# Patient Record
Sex: Male | Born: 1965 | Race: Black or African American | Hispanic: No | Marital: Married | Smoking: Former smoker
Health system: Southern US, Community
[De-identification: ages and names within clinical notes are randomized; demographics above are authoritative.]

## PROBLEM LIST (undated history)

## (undated) DIAGNOSIS — I10 Essential (primary) hypertension: Secondary | ICD-10-CM

## (undated) DIAGNOSIS — R7303 Prediabetes: Secondary | ICD-10-CM

## (undated) DIAGNOSIS — E785 Hyperlipidemia, unspecified: Secondary | ICD-10-CM

## (undated) DIAGNOSIS — M109 Gout, unspecified: Secondary | ICD-10-CM

---

## 2005-06-11 ENCOUNTER — Ambulatory Visit: Payer: Self-pay | Admitting: Urology

## 2005-06-25 ENCOUNTER — Ambulatory Visit: Payer: Self-pay | Admitting: Urology

## 2006-12-31 ENCOUNTER — Ambulatory Visit: Payer: Self-pay | Admitting: Urology

## 2007-10-02 ENCOUNTER — Ambulatory Visit: Payer: Self-pay | Admitting: Emergency Medicine

## 2007-10-03 ENCOUNTER — Ambulatory Visit: Payer: Self-pay | Admitting: Internal Medicine

## 2007-10-04 ENCOUNTER — Encounter: Payer: Self-pay | Admitting: Internal Medicine

## 2014-11-26 ENCOUNTER — Ambulatory Visit: Payer: Self-pay | Admitting: Gastroenterology

## 2015-04-22 LAB — SURGICAL PATHOLOGY

## 2020-05-06 ENCOUNTER — Other Ambulatory Visit: Payer: Self-pay

## 2020-05-06 ENCOUNTER — Emergency Department: Payer: BC Managed Care – PPO

## 2020-05-06 ENCOUNTER — Encounter: Payer: Self-pay | Admitting: Emergency Medicine

## 2020-05-06 ENCOUNTER — Emergency Department
Admission: EM | Admit: 2020-05-06 | Discharge: 2020-05-06 | Disposition: A | Discharge status: Home / Self Care | Payer: BC Managed Care – PPO | Attending: Emergency Medicine | Admitting: Emergency Medicine

## 2020-05-06 DIAGNOSIS — M6282 Rhabdomyolysis: Secondary | ICD-10-CM | POA: Insufficient documentation

## 2020-05-06 DIAGNOSIS — M25511 Pain in right shoulder: Secondary | ICD-10-CM | POA: Diagnosis present

## 2020-05-06 DIAGNOSIS — Z79899 Other long term (current) drug therapy: Secondary | ICD-10-CM | POA: Diagnosis not present

## 2020-05-06 DIAGNOSIS — M25512 Pain in left shoulder: Secondary | ICD-10-CM | POA: Insufficient documentation

## 2020-05-06 LAB — URINALYSIS, COMPLETE (UACMP) WITH MICROSCOPIC
Bacteria, UA: NONE SEEN
Bilirubin Urine: NEGATIVE
Glucose, UA: NEGATIVE mg/dL
Hgb urine dipstick: NEGATIVE
Ketones, ur: NEGATIVE mg/dL
Leukocytes,Ua: NEGATIVE
Nitrite: NEGATIVE
Protein, ur: NEGATIVE mg/dL
Specific Gravity, Urine: 1.017 (ref 1.005–1.030)
Squamous Epithelial / HPF: NONE SEEN (ref 0–5)
pH: 5 (ref 5.0–8.0)

## 2020-05-06 MED ORDER — DICLOFENAC SODIUM 50 MG PO TBEC: 50.0000 mg | DELAYED_RELEASE_TABLET | Freq: Two times a day (BID) | ORAL | 0 refills | Status: DC

## 2020-05-06 MED ORDER — CYCLOBENZAPRINE HCL 5 MG PO TABS
5.0000 mg | ORAL_TABLET | Freq: Three times a day (TID) | ORAL | 0 refills | Status: DC | PRN
Start: 2020-05-06 — End: 2021-03-17

## 2020-05-06 NOTE — ED Notes (Signed)
See triage note  Presents with bilateral shoulder pain  States he lifted a zero turn mower out of ditch about 3 weeks ago   Was seen at [facility] about 1 week ago  conts to have pain and decreased ROM

## 2020-05-06 NOTE — ED Provider Notes (Signed)
[facility] Emergency Department Provider Note ____________________________________________  Time seen: 0845  I have reviewed the triage vital signs and the nursing notes.  HISTORY  Chief Complaint  Shoulder Pain  HPI Daryl Jones is a 54 y.o. male presents to the ED for his 3rd visit related to bilateral shoulder pain. He was initially evaluated at a local urgent care center.  He presented after he apparently drove his 0 turn lawnmower into a ditch.  Patient reportedly was successful in pulling the lawnmower from the ditch.  He describes delayed onset of bilateral arm and muscle pain from that incident.  He was treated initially with ibuprofen for his discomfort.  He subsequently reported to a local urgent care, and was again treated and evaluated for delayed onset of muscle soreness and possible tendinitis to the bilateral shoulders.  He was given Voltaren gel and an anti-inflammatory at that time.  He presents today with continued pain and stiffness to the bilateral upper arms.  He is unable to extend or reach the arms overhead, even in an attempt to wash his face or brush his hair.  He denies any distal paresthesias, grip changes, chest pain, shortness of breath, or weakness.  He describes muscle pain and tightness to the bilateral arms.  Patient reports normal urination and denies any dark-colored urine.  He denies any significant injury at this time.  History reviewed. No pertinent past medical history.  There are no problems to display for this patient.  History reviewed. No pertinent surgical history.  Prior to Admission medications   Medication Sig Start Date End Date Taking? Authorizing Provider  amLODipine (NORVASC) 5 MG tablet Take 5 mg by mouth daily.   Yes [provider]  atorvastatin (LIPITOR) 40 MG tablet Take 40 mg by mouth daily.   Yes [provider]  lisinopril-hydrochlorothiazide (ZESTORETIC) 20-25 MG tablet Take 1 tablet by  mouth daily.   Yes [provider]  cyclobenzaprine (FLEXERIL) 5 MG tablet Take 1 tablet (5 mg total) by mouth 3 (three) times daily as needed. 05/06/20   Chanta Bauers, Dannielle Karvonen, PA-C  diclofenac (VOLTAREN) 50 MG EC tablet Take 1 tablet (50 mg total) by mouth 2 (two) times daily. 05/06/20   Corbyn Steedman, Dannielle Karvonen, PA-C    Allergies Morphine and related  No family history on file.  Social History Social History   Tobacco Use  . Smoking status: Never Smoker  . Smokeless tobacco: Never Used  Substance Use Topics  . Alcohol use: Not Currently  . Drug use: Not on file    Review of Systems  Constitutional: Negative for fever. Eyes: Negative for visual changes. ENT: Negative for sore throat. Cardiovascular: Negative for chest pain. Respiratory: Negative for shortness of breath. Gastrointestinal: Negative for abdominal pain, vomiting and diarrhea. Genitourinary: Negative for dysuria. Musculoskeletal: Negative for back pain.  Bilateral upper extremity muscle stiffness and weakness as noted. Skin: Negative for rash. Neurological: Negative for headaches, focal weakness or numbness. ____________________________________________  PHYSICAL EXAM:  VITAL SIGNS: ED Triage Vitals  Enc Vitals Group     BP 05/06/20 0808 (!) 161/96     Pulse Rate 05/06/20 0808 97     Resp 05/06/20 0808 16     Temp 05/06/20 0808 98.1 F (36.7 C)     Temp Source 05/06/20 0808 Oral     SpO2 05/06/20 0808 98 %     Weight 05/06/20 0809 251 lb (113.9 kg)     Height 05/06/20 0809 5\' 11"  (  1.803 m)     Head Circumference --      Peak Flow --      Pain Score 05/06/20 0809 8     Pain Loc --      Pain Edu? --      Excl. in GC? --     Constitutional: Alert and oriented. Well appearing and in no distress. Head: Normocephalic and atraumatic. Eyes: Conjunctivae are normal. Normal extraocular movements Neck: Supple. No thyromegaly. Cardiovascular: Normal rate, regular rhythm. Normal distal  pulses. Respiratory: Normal respiratory effort. No wheezes/rales/rhonchi. Musculoskeletal:  Nontender with normal range of motion in all extremities.  Neurologic:  Normal gait without ataxia. Normal speech and language. No gross focal neurologic deficits are appreciated. Skin:  Skin is warm, dry and intact. No rash noted. Psychiatric: Mood and affect are normal. Patient exhibits appropriate insight and ____________________________________________   RADIOLOGY  DG Left Shoulder IMPRESSION: 1.  No acute osseous abnormality. 2. Mild acromioclavicular and early glenohumeral osteoarthritis.  DG Right Shoulder IMPRESSION: 1.  No acute osseous abnormality. 2. Mild acromioclavicular osteoarthritis. ____________________________________________  LABORATORY  Labs Reviewed  URINALYSIS, COMPLETE (UACMP) WITH MICROSCOPIC - Abnormal; Notable for the following components:      Result Value   Color, Urine YELLOW (*)    APPearance HAZY (*)    All other components within normal limits   Procedures ____________________________________________  INITIAL IMPRESSION / ASSESSMENT AND PLAN / ED COURSE  DDX:  Rotator cuff tendinitis, AC separation, bursitis, biceps tendonitis, myositis/rhabdomyolisis  Patient with ED evaluation of bilateral muscle pain, soreness, and stiffness.  Clinical picture is consistent with a likely mild rhabdo myelitis secondary to his significant muscle strain.  X-rays are negative for any acute fractures or dislocations.  Patient's urinalysis did not reveal any myoglobin or hemoglobin.  He will treat with diclofenac as well as Flexeril.  He is given discharge instructions on the likely self-limited nature of his symptoms, but is encouraged to drink plenty of fluids and report any change in urination.  Return precautions have been reviewed but he is again referred to orthopedics in case symptoms are likely related to any underlying AC arthritis or rotator cuff tendinitis.  Daryl Jones was evaluated in Emergency Department on 05/06/2020 for the symptoms described in the history of present illness. He was evaluated in the context of the global COVID-19 pandemic, which necessitated consideration that the patient might be at risk for infection with the SARS-CoV-2 virus that causes COVID-19. Institutional protocols and algorithms that pertain to the evaluation of patients at risk for COVID-19 are in a state of rapid change based on information released by regulatory bodies including the CDC and federal and state organizations. These policies and algorithms were followed during the patient's care in the ED. ____________________________________________  FINAL CLINICAL IMPRESSION(S) / ED DIAGNOSES  Final diagnoses:  Acute pain of both shoulders  Non-traumatic rhabdomyolysis      Karmen Stabs, Charlesetta Ivory, PA-C 05/06/20 1529    Concha Se, MD 05/07/20 1910

## 2020-05-06 NOTE — ED Triage Notes (Signed)
Says he lifted very heavy object aobut 2 week ago and injured both shoulders.  He has been to urgent care and that has not helped.  He continues to have difficulty lifting his arms up.

## 2020-05-06 NOTE — Discharge Instructions (Addendum)
Your are being treated for overuse of the muscles causing severe muscle strain and stiffness. Drink plenty of non-carbonated drinks (Gatorade, water, etc) and monitor for dark urine. Take the prescription pain medicine and muscle relaxant as directed. Apply moist heat or perform epsom salt soaks to increase muscle movement. Follow-up with Ortho for continued shoulder joint pain/stiffness.

## 2020-05-06 NOTE — ED Triage Notes (Incomplete)
Says he picked up something very heavy a couple weeks ago and injured both shoulders.  Says he went to urgent care and they gave him ibuprofen and some gel and he still cant move his arms.  He has difficulty lifting them and could not slee ?

## 2020-05-30 ENCOUNTER — Ambulatory Visit: Payer: BC Managed Care – PPO | Attending: Internal Medicine

## 2020-05-30 DIAGNOSIS — Z23 Encounter for immunization: Secondary | ICD-10-CM

## 2020-05-30 NOTE — Progress Notes (Signed)
   Covid-19 Vaccination Clinic  Name:  Daryl Jones    MRN: [id number] DOB: 10-21-1966  05/30/2020  [name] was observed post Covid-19 immunization for 15 minutes without incident. He was provided with Vaccine Information Sheet and instruction to access the V-Safe system.   [name] was instructed to call 911 with any severe reactions post vaccine: Marland Kitchen Difficulty breathing  . Swelling of face and throat  . A fast heartbeat  . A bad rash all over body  . Dizziness and weakness   Immunizations Administered    Name Date Dose VIS Date Route   Moderna COVID-19 Vaccine 05/30/2020  2:35 PM 0.5 mL 11/2019 Intramuscular   Manufacturer: Moderna   Lot: [id number]   NDC: 55001-642-90

## 2020-07-16 ENCOUNTER — Ambulatory Visit: Payer: BC Managed Care – PPO | Attending: Critical Care Medicine

## 2020-07-16 DIAGNOSIS — Z23 Encounter for immunization: Secondary | ICD-10-CM

## 2020-07-16 NOTE — Progress Notes (Signed)
   Covid-19 Vaccination Clinic  Name:  Daryl Jones    MRN: [id number] DOB: July 16, 1966  07/16/2020  [name] was observed post Covid-19 immunization for 15 minutes without incident. He was provided with Vaccine Information Sheet and instruction to access the V-Safe system.   [name] was instructed to call 911 with any severe reactions post vaccine: Marland Kitchen Difficulty breathing  . Swelling of face and throat  . A fast heartbeat  . A bad rash all over body  . Dizziness and weakness   Immunizations Administered    Name Date Dose VIS Date Route   Moderna COVID-19 Vaccine 07/16/2020 12:37 PM 0.5 mL 11/2019 Intramuscular   Manufacturer: Moderna   Lot: [id number]   NDC: 19509-326-71

## 2021-03-17 ENCOUNTER — Encounter
Admission: EM | Disposition: A | Discharge status: Home / Self Care | Payer: Self-pay | Source: Home / Self Care | Attending: Emergency Medicine

## 2021-03-17 ENCOUNTER — Encounter: Payer: Self-pay | Admitting: Surgery

## 2021-03-17 ENCOUNTER — Emergency Department: Payer: BC Managed Care – PPO

## 2021-03-17 ENCOUNTER — Observation Stay: Payer: BC Managed Care – PPO | Admitting: Anesthesiology

## 2021-03-17 ENCOUNTER — Other Ambulatory Visit: Payer: Self-pay

## 2021-03-17 ENCOUNTER — Observation Stay
Admission: EM | Admit: 2021-03-17 | Discharge: 2021-03-18 | Disposition: A | Discharge status: Home / Self Care | Payer: BC Managed Care – PPO | Attending: Surgery | Admitting: Surgery

## 2021-03-17 DIAGNOSIS — K8001 Calculus of gallbladder with acute cholecystitis with obstruction: Secondary | ICD-10-CM

## 2021-03-17 DIAGNOSIS — K82A1 Gangrene of gallbladder in cholecystitis: Secondary | ICD-10-CM | POA: Diagnosis not present

## 2021-03-17 DIAGNOSIS — K8000 Calculus of gallbladder with acute cholecystitis without obstruction: Secondary | ICD-10-CM | POA: Diagnosis not present

## 2021-03-17 DIAGNOSIS — I10 Essential (primary) hypertension: Secondary | ICD-10-CM | POA: Diagnosis not present

## 2021-03-17 DIAGNOSIS — Z79899 Other long term (current) drug therapy: Secondary | ICD-10-CM | POA: Diagnosis not present

## 2021-03-17 DIAGNOSIS — K810 Acute cholecystitis: Secondary | ICD-10-CM | POA: Diagnosis present

## 2021-03-17 DIAGNOSIS — K82A2 Perforation of gallbladder in cholecystitis: Secondary | ICD-10-CM

## 2021-03-17 DIAGNOSIS — Z20822 Contact with and (suspected) exposure to covid-19: Secondary | ICD-10-CM | POA: Diagnosis not present

## 2021-03-17 DIAGNOSIS — R1011 Right upper quadrant pain: Secondary | ICD-10-CM | POA: Diagnosis present

## 2021-03-17 HISTORY — PX: CHOLECYSTECTOMY: SHX55

## 2021-03-17 LAB — TROPONIN I (HIGH SENSITIVITY)
Troponin I (High Sensitivity): 4 ng/L (ref <18)
Troponin I (High Sensitivity): 3 ng/L (ref <18)

## 2021-03-17 LAB — URINALYSIS, ROUTINE W REFLEX MICROSCOPIC
Bacteria, UA: NONE SEEN
Bilirubin Urine: NEGATIVE
Glucose, UA: NEGATIVE mg/dL
Ketones, ur: NEGATIVE mg/dL
Leukocytes,Ua: NEGATIVE
Nitrite: NEGATIVE
Protein, ur: NEGATIVE mg/dL
Specific Gravity, Urine: 1.018 (ref 1.005–1.030)
Squamous Epithelial / HPF: NONE SEEN (ref 0–5)
pH: 5 (ref 5.0–8.0)

## 2021-03-17 LAB — HEPATIC FUNCTION PANEL
ALT: 24 U/L (ref 0–44)
AST: 29 U/L (ref 15–41)
Albumin: 4.4 g/dL (ref 3.5–5.0)
Alkaline Phosphatase: 55 U/L (ref 38–126)
Bilirubin, Direct: 0.1 mg/dL (ref 0.0–0.2)
Indirect Bilirubin: 1.3 mg/dL — ABNORMAL HIGH (ref 0.3–0.9)
Total Bilirubin: 1.4 mg/dL — ABNORMAL HIGH (ref 0.3–1.2)
Total Protein: 8 g/dL (ref 6.5–8.1)

## 2021-03-17 LAB — BASIC METABOLIC PANEL
Anion gap: 12 (ref 5–15)
BUN: 15 mg/dL (ref 6–20)
CO2: 22 mmol/L (ref 22–32)
Calcium: 9.3 mg/dL (ref 8.9–10.3)
Chloride: 101 mmol/L (ref 98–111)
Creatinine, Ser: 0.79 mg/dL (ref 0.61–1.24)
GFR, Estimated: >60 mL/min (ref >60)
Glucose, Bld: 128 mg/dL — ABNORMAL HIGH (ref 70–99)
Potassium: 3.6 mmol/L (ref 3.5–5.1)
Sodium: 135 mmol/L (ref 135–145)

## 2021-03-17 LAB — BRAIN NATRIURETIC PEPTIDE: B Natriuretic Peptide: 6.4 pg/mL (ref 0.0–100.0)

## 2021-03-17 LAB — CBC
HCT: 41.2 % (ref 39.0–52.0)
Hemoglobin: 14.1 g/dL (ref 13.0–17.0)
MCH: 31.5 pg (ref 26.0–34.0)
MCHC: 34.2 g/dL (ref 30.0–36.0)
MCV: 92 fL (ref 80.0–100.0)
Platelets: 263 10*3/uL (ref 150–400)
RBC: 4.48 MIL/uL (ref 4.22–5.81)
RDW: 11.9 % (ref 11.5–15.5)
WBC: 10.3 10*3/uL (ref 4.0–10.5)
nRBC: 0 % (ref 0.0–0.2)

## 2021-03-17 LAB — RESP PANEL BY RT-PCR (FLU A&B, COVID) ARPGX2
Influenza A by PCR: NEGATIVE
Influenza B by PCR: NEGATIVE
SARS Coronavirus 2 by RT PCR: NEGATIVE

## 2021-03-17 LAB — LIPASE, BLOOD: Lipase: 29 U/L (ref 11–51)

## 2021-03-17 LAB — ETHANOL: Alcohol, Ethyl (B): <10 mg/dL (ref <10)

## 2021-03-17 IMAGING — CT CT ABD-PELV W/ CM
2 of 5 series · 15 of 46 positions shown, 17 images · IV contrast (APPLIED)
Comparison: Portable chest x-ray earlier today.

CLINICAL DATA: 54-year-old male with chest pain, shortness of
breath, left upper quadrant abdominal pain. Appears and distress.

EXAM:
CT ABDOMEN AND PELVIS WITH CONTRAST
TECHNIQUE: Multidetector CT imaging of the abdomen and pelvis was performed
using the standard protocol following bolus administration of
intravenous contrast.
CONTRAST:  100mL OMNIPAQUE IOHEXOL 300 MG/ML  SOLN

[Series 2: routine abd/pel with · axial · 0.98mm/px · z∈[-1063,-613]mm · 12 of 101 slices shown, 14 images]
[im 6/101  soft-tissue]
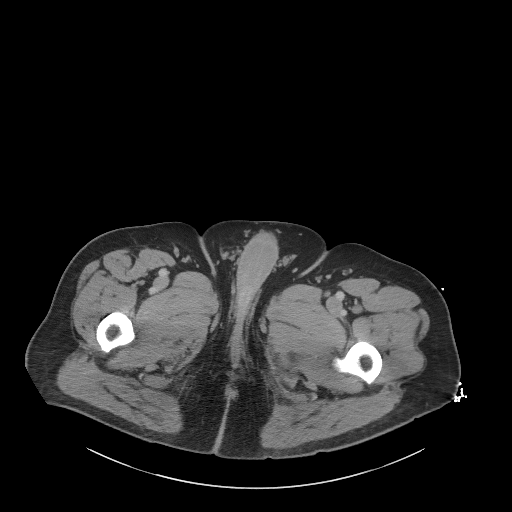
[im 6/101  bone]
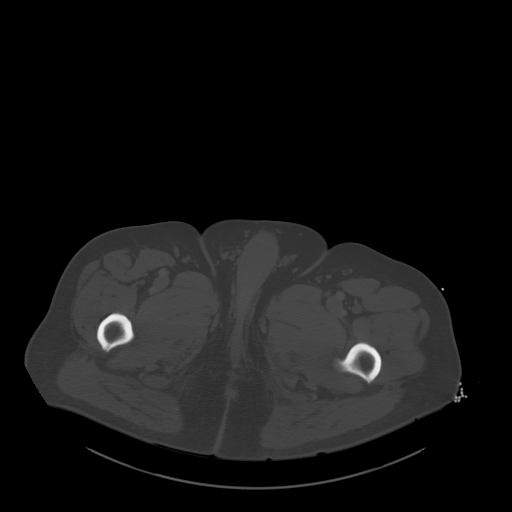
[im 16/101  soft-tissue]
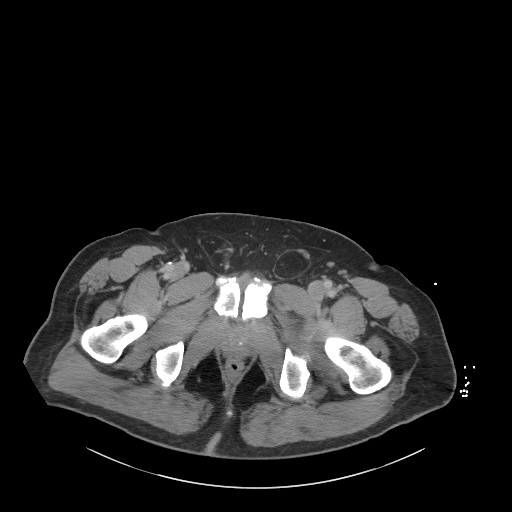
[im 21/101  soft-tissue]
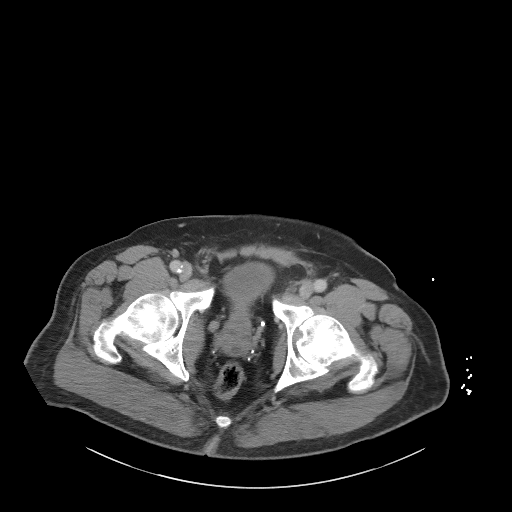
[im 31/101  soft-tissue]
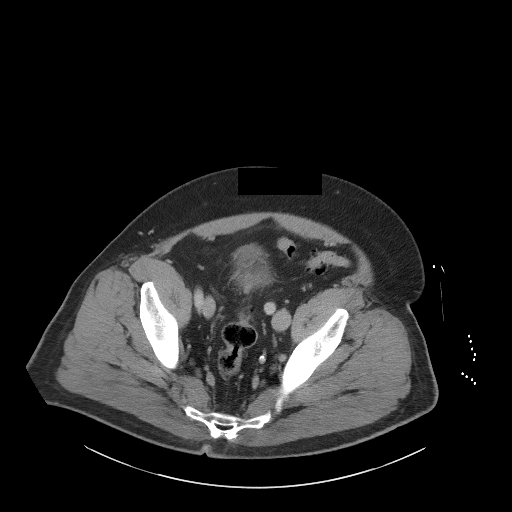
[im 41/101  soft-tissue]
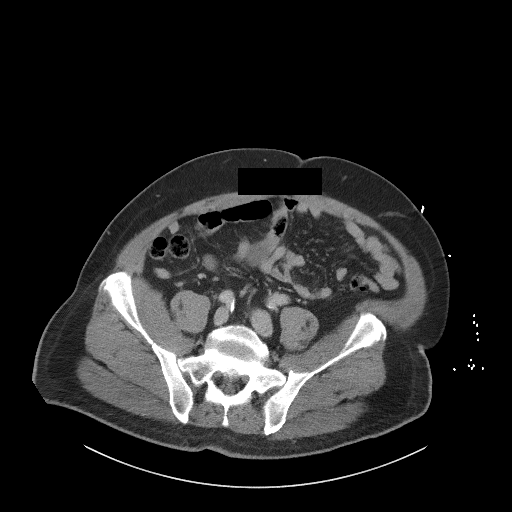
[im 46/101  soft-tissue]
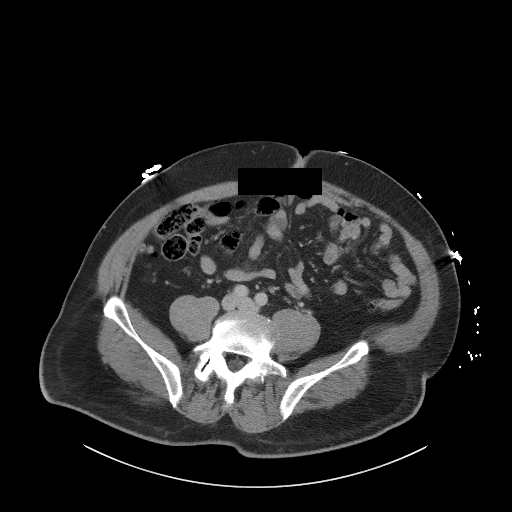
[im 56/101  soft-tissue]
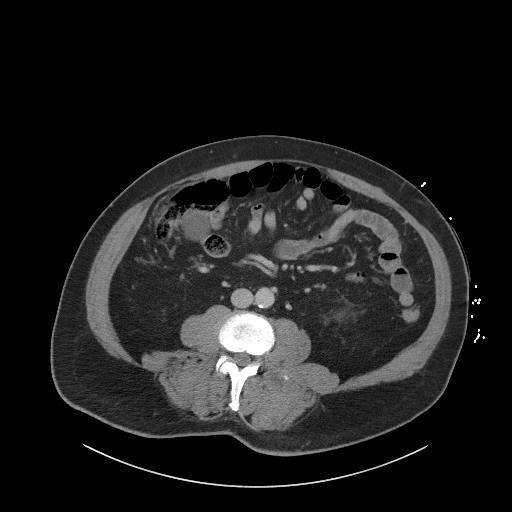
[im 61/101  soft-tissue]
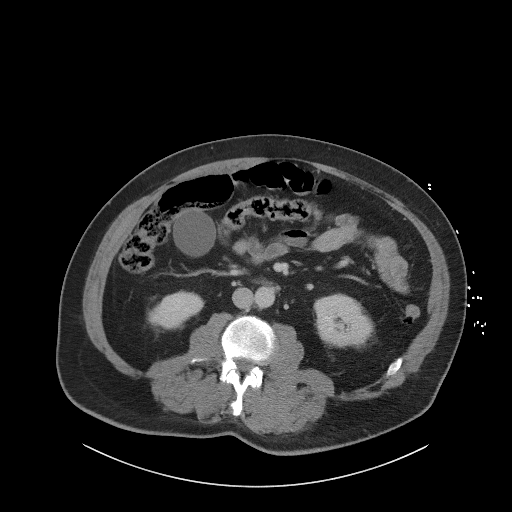
[im 71/101  soft-tissue]
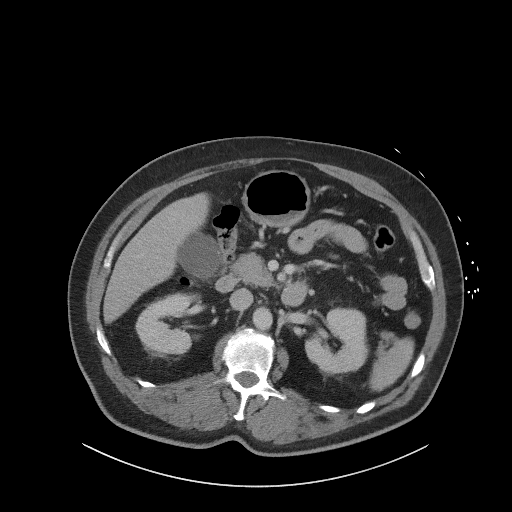
[im 71/101  bone]
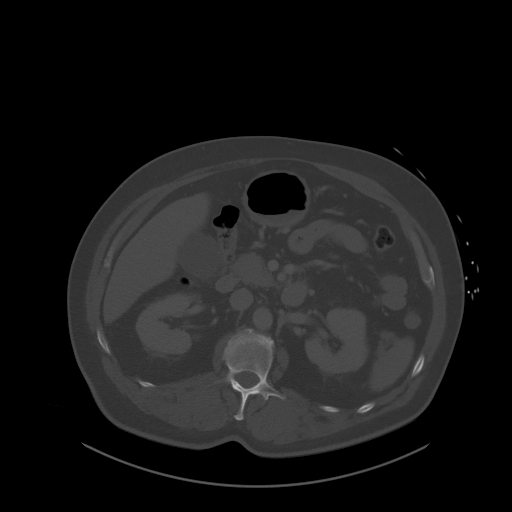
[im 81/101  soft-tissue]
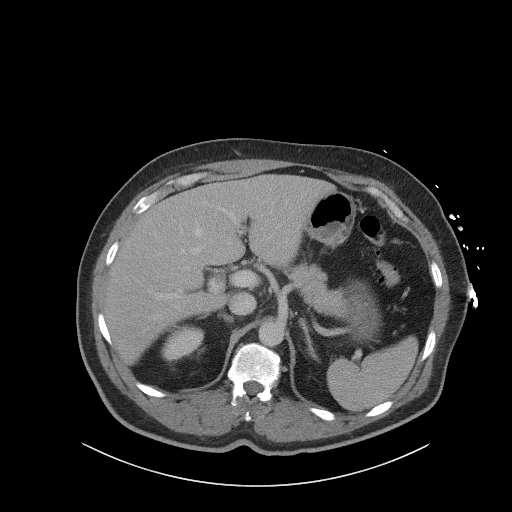
[im 86/101  soft-tissue]
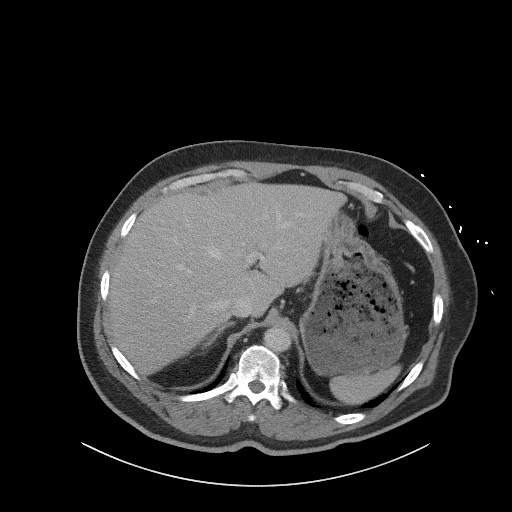
[im 96/101  soft-tissue]
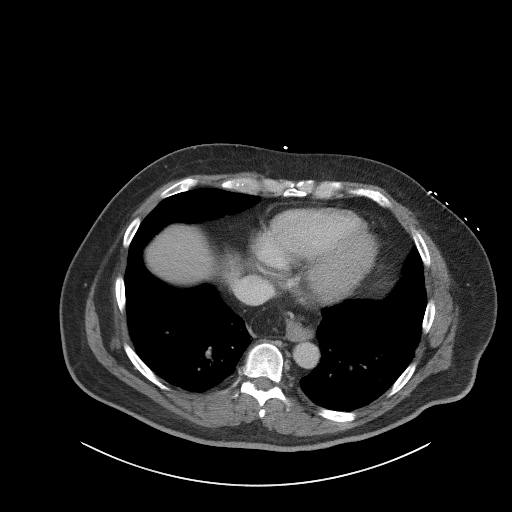

[Series 5: coronal st · coronal · 0.86mm/px · 3 of 111 slices shown]
[im 37/111  soft-tissue]
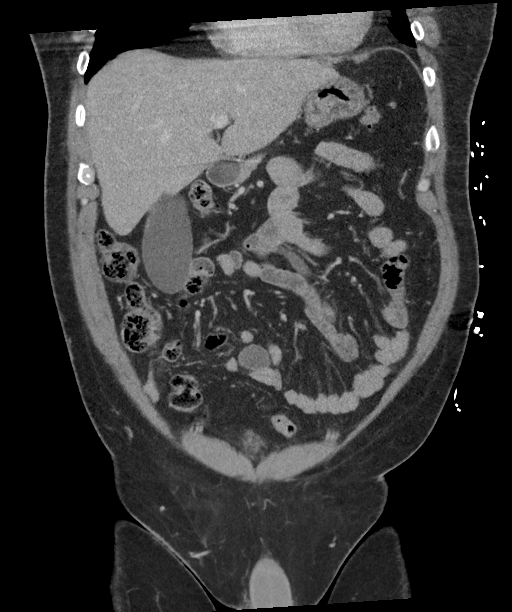
[im 49/111  soft-tissue]
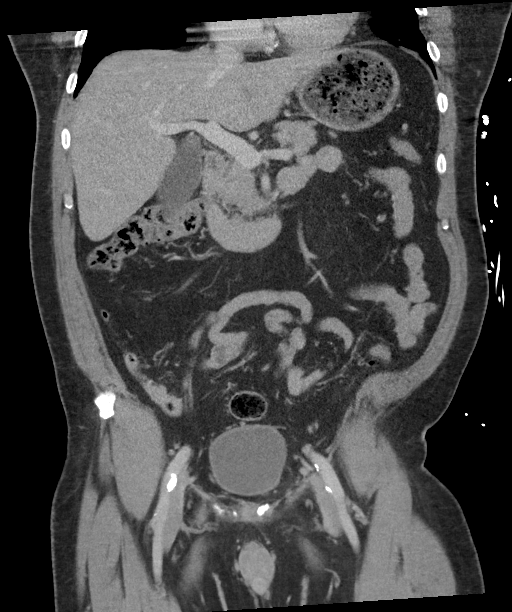
[im 62/111  soft-tissue]
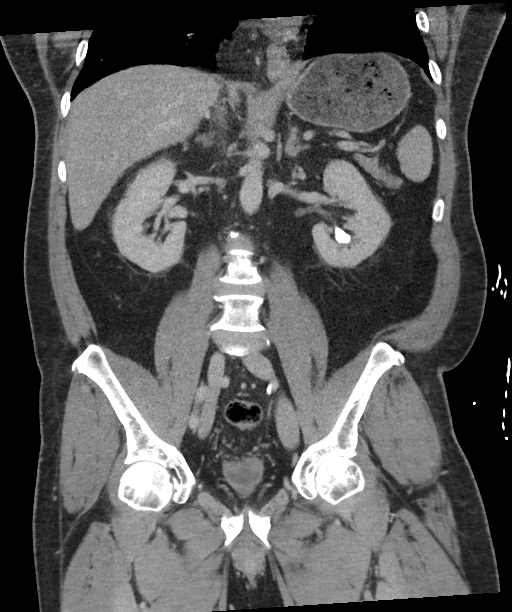

[15 of 46 positions shown; findings below may reference images not displayed]

FINDINGS: Lower chest: Mild respiratory motion. Mostly dependent opacity at
both lung bases which more resembles atelectasis than infection. No
pleural effusion. Cardiac size at the upper limits of normal. No
pericardial effusion.

Hepatobiliary: The gallbladder is enlarged and segments of the
gallbladder wall appear indistinct (sagittal image 51) with a 2 cm
hyperdense stone in the gallbladder neck (coronal image 53). No
superimposed bile duct enlargement. Liver enhancement remains within
normal limits.

Pancreas: Negative.

Spleen: Negative.

Adrenals/Urinary Tract: Normal adrenal glands.

Negative right kidney and right ureter.

There is a left lower pole developing staghorn renal calculus which
is triangular and about 16 mm in size currently. But no left
hydronephrosis. Decompressed and negative left ureter. Symmetric
renal enhancement and contrast excretion. Diminutive and
unremarkable urinary bladder. Numerous pelvic phleboliths
incidentally noted.

Stomach/Bowel: Redundant sigmoid colon with diverticulosis from the
splenic flexure to the mid sigmoid. No active inflammation
identified. Negative rectum. Redundant right colon. Normal appendix
on series 2, image 52. Flocculated material in the terminal ileum
which otherwise appears negative. No dilated small bowel. Small fat
containing hiatal hernia. Stomach is mildly to moderately distended
with air and food debris. Duodenum is decompressed and negative. No
free air. No free fluid. No mesenteric inflammation identified.

Vascular/Lymphatic: Major arterial structures are patent.
Iliofemoral calcified atherosclerosis, aorta largely spared. Portal
venous system is patent. No lymphadenopathy.

Reproductive: Fat containing left inguinal hernia (series 2, image
86). Otherwise negative.

Other: No pelvic free fluid.

Musculoskeletal: No acute osseous abnormality identified. Advanced
facet degeneration at the lumbosacral junction.
IMPRESSION: 1. Positive for evidence of Acute Cholecystitis: dilated gallbladder
with a relatively large 2 cm stone in the neck. No superimposed bile
duct enlargement.
2. No other acute or inflammatory process identified in the abdomen
or pelvis. Visible lung base opacity more resembles atelectasis than
infection.
3. Developing left lower pole staghorn renal calculus, currently 16
mm. No obstructive uropathy.
4. Diverticulosis of the large bowel. Fat containing hiatal and left
inguinal hernias.

## 2021-03-17 SURGERY — CHOLECYSTECTOMY, ROBOT-ASSISTED, LAPAROSCOPIC
Anesthesia: General | Site: Abdomen

## 2021-03-17 MED ORDER — IPRATROPIUM-ALBUTEROL 0.5-2.5 (3) MG/3ML IN SOLN: 3.0000 mL | Freq: Once | RESPIRATORY_TRACT | Status: DC

## 2021-03-17 MED ORDER — IOHEXOL 300 MG/ML  SOLN
100.0000 mL | Freq: Once | INTRAMUSCULAR | Status: AC | PRN
  Administered 2021-03-17: 100 mL via INTRAVENOUS

## 2021-03-17 MED ORDER — LACTATED RINGERS IV SOLN: INTRAVENOUS | Status: DC | PRN

## 2021-03-17 MED ORDER — MIDAZOLAM HCL 2 MG/2ML IJ SOLN
INTRAMUSCULAR | Status: AC
  Filled 2021-03-17: qty 2

## 2021-03-17 MED ORDER — KETOROLAC TROMETHAMINE 30 MG/ML IJ SOLN: 30.0000 mg | Freq: Four times a day (QID) | INTRAMUSCULAR | Status: DC | PRN

## 2021-03-17 MED ORDER — ONDANSETRON HCL 4 MG/2ML IJ SOLN: 4.0000 mg | Freq: Once | INTRAMUSCULAR | Status: DC | PRN

## 2021-03-17 MED ORDER — DEXAMETHASONE SODIUM PHOSPHATE 10 MG/ML IJ SOLN
INTRAMUSCULAR | Status: AC
  Filled 2021-03-17: qty 1

## 2021-03-17 MED ORDER — BUPIVACAINE-EPINEPHRINE (PF) 0.25% -1:200000 IJ SOLN
INTRAMUSCULAR | Status: AC
  Filled 2021-03-17: qty 30

## 2021-03-17 MED ORDER — PHENYLEPHRINE HCL (PRESSORS) 10 MG/ML IV SOLN
INTRAVENOUS | Status: DC | PRN
  Administered 2021-03-17: 200 ug via INTRAVENOUS
  Administered 2021-03-17: 100 ug via INTRAVENOUS
  Administered 2021-03-17: 200 ug via INTRAVENOUS
  Administered 2021-03-17: 100 ug via INTRAVENOUS
  Administered 2021-03-17: 200 ug via INTRAVENOUS
  Administered 2021-03-17: 100 ug via INTRAVENOUS

## 2021-03-17 MED ORDER — HYDROMORPHONE HCL 1 MG/ML IJ SOLN: 0.5000 mg | INTRAMUSCULAR | Status: DC | PRN

## 2021-03-17 MED ORDER — ONDANSETRON HCL 4 MG/2ML IJ SOLN: 4.0000 mg | Freq: Four times a day (QID) | INTRAMUSCULAR | Status: DC | PRN

## 2021-03-17 MED ORDER — BUPIVACAINE-EPINEPHRINE (PF) 0.25% -1:200000 IJ SOLN
INTRAMUSCULAR | Status: DC | PRN
  Administered 2021-03-17: 20 mL via PERINEURAL

## 2021-03-17 MED ORDER — OXYCODONE HCL 5 MG/5ML PO SOLN: 5.0000 mg | Freq: Once | ORAL | Status: DC | PRN

## 2021-03-17 MED ORDER — ROCURONIUM BROMIDE 100 MG/10ML IV SOLN
INTRAVENOUS | Status: DC | PRN
  Administered 2021-03-17: 20 mg via INTRAVENOUS
  Administered 2021-03-17: 50 mg via INTRAVENOUS
  Administered 2021-03-17: 30 mg via INTRAVENOUS

## 2021-03-17 MED ORDER — LIDOCAINE HCL (CARDIAC) PF 100 MG/5ML IV SOSY
PREFILLED_SYRINGE | INTRAVENOUS | Status: DC | PRN
  Administered 2021-03-17: 100 mg via INTRAVENOUS

## 2021-03-17 MED ORDER — ACETAMINOPHEN 500 MG PO TABS
1000.0000 mg | ORAL_TABLET | Freq: Four times a day (QID) | ORAL | Status: DC
  Administered 2021-03-18 (×2): 1000 mg via ORAL
  Filled 2021-03-17 (×5): qty 2

## 2021-03-17 MED ORDER — ONDANSETRON HCL 4 MG/2ML IJ SOLN
INTRAMUSCULAR | Status: AC
  Filled 2021-03-17: qty 2

## 2021-03-17 MED ORDER — HYDROMORPHONE HCL 1 MG/ML IJ SOLN
1.0000 mg | INTRAMUSCULAR | Status: DC | PRN
  Administered 2021-03-17: 1 mg via INTRAVENOUS
  Filled 2021-03-17: qty 1

## 2021-03-17 MED ORDER — IPRATROPIUM-ALBUTEROL 0.5-2.5 (3) MG/3ML IN SOLN
RESPIRATORY_TRACT | Status: AC
  Administered 2021-03-17: 3 mL
  Filled 2021-03-17: qty 3

## 2021-03-17 MED ORDER — PROPOFOL 10 MG/ML IV BOLUS
INTRAVENOUS | Status: AC
  Filled 2021-03-17: qty 20

## 2021-03-17 MED ORDER — LISINOPRIL 20 MG PO TABS
20.0000 mg | ORAL_TABLET | Freq: Every day | ORAL | Status: DC
  Administered 2021-03-17 – 2021-03-18 (×2): 20 mg via ORAL
  Filled 2021-03-17 (×2): qty 1

## 2021-03-17 MED ORDER — FENTANYL CITRATE (PF) 100 MCG/2ML IJ SOLN
INTRAMUSCULAR | Status: AC
  Filled 2021-03-17: qty 2

## 2021-03-17 MED ORDER — ACETAMINOPHEN 500 MG PO TABS
ORAL_TABLET | ORAL | Status: AC
  Administered 2021-03-17: 1000 mg via ORAL
  Filled 2021-03-17: qty 2

## 2021-03-17 MED ORDER — INDOCYANINE GREEN 25 MG IV SOLR
2.5000 mg | Freq: Once | INTRAVENOUS | Status: AC
  Administered 2021-03-17: 2.5 mg via INTRAVENOUS
  Filled 2021-03-17: qty 1

## 2021-03-17 MED ORDER — SODIUM CHLORIDE 0.9 % IV SOLN: INTRAVENOUS | Status: DC

## 2021-03-17 MED ORDER — PROPOFOL 10 MG/ML IV BOLUS
INTRAVENOUS | Status: DC | PRN
  Administered 2021-03-17: 120 mg via INTRAVENOUS

## 2021-03-17 MED ORDER — OXYCODONE HCL 5 MG PO TABS: 5.0000 mg | ORAL_TABLET | ORAL | Status: DC | PRN

## 2021-03-17 MED ORDER — ONDANSETRON HCL 4 MG/2ML IJ SOLN
INTRAMUSCULAR | Status: DC | PRN
  Administered 2021-03-17: 4 mg via INTRAVENOUS

## 2021-03-17 MED ORDER — ONDANSETRON 4 MG PO TBDP: 4.0000 mg | ORAL_TABLET | Freq: Four times a day (QID) | ORAL | Status: DC | PRN

## 2021-03-17 MED ORDER — ONDANSETRON HCL 4 MG/2ML IJ SOLN
4.0000 mg | Freq: Once | INTRAMUSCULAR | Status: AC
  Administered 2021-03-17: 4 mg via INTRAVENOUS
  Filled 2021-03-17: qty 2

## 2021-03-17 MED ORDER — KETOROLAC TROMETHAMINE 30 MG/ML IJ SOLN
30.0000 mg | Freq: Four times a day (QID) | INTRAMUSCULAR | Status: AC
  Administered 2021-03-17: 30 mg via INTRAVENOUS
  Filled 2021-03-17: qty 1

## 2021-03-17 MED ORDER — SUCCINYLCHOLINE CHLORIDE 20 MG/ML IJ SOLN
INTRAMUSCULAR | Status: DC | PRN
  Administered 2021-03-17: 100 mg via INTRAVENOUS

## 2021-03-17 MED ORDER — LIDOCAINE HCL (PF) 2 % IJ SOLN
INTRAMUSCULAR | Status: AC
  Filled 2021-03-17: qty 5

## 2021-03-17 MED ORDER — HYDROMORPHONE HCL 1 MG/ML IJ SOLN
1.0000 mg | Freq: Once | INTRAMUSCULAR | Status: AC
  Administered 2021-03-17: 1 mg via INTRAVENOUS
  Filled 2021-03-17: qty 1

## 2021-03-17 MED ORDER — ONDANSETRON HCL 4 MG/2ML IJ SOLN
4.0000 mg | Freq: Four times a day (QID) | INTRAMUSCULAR | Status: DC | PRN
  Administered 2021-03-17: 4 mg via INTRAVENOUS
  Filled 2021-03-17: qty 2

## 2021-03-17 MED ORDER — SODIUM CHLORIDE 0.9 % IV BOLUS (SEPSIS): 1000.0000 mL | Freq: Once | INTRAVENOUS | Status: DC

## 2021-03-17 MED ORDER — FENTANYL CITRATE (PF) 100 MCG/2ML IJ SOLN
25.0000 ug | INTRAMUSCULAR | Status: DC | PRN
  Administered 2021-03-17: 25 ug via INTRAVENOUS

## 2021-03-17 MED ORDER — SODIUM CHLORIDE 0.9 % IV SOLN
INTRAVENOUS | Status: DC | PRN
  Administered 2021-03-17: 50 ug/min via INTRAVENOUS

## 2021-03-17 MED ORDER — BUPIVACAINE LIPOSOME 1.3 % IJ SUSP
INTRAMUSCULAR | Status: AC
  Filled 2021-03-17: qty 20

## 2021-03-17 MED ORDER — SODIUM CHLORIDE 0.9 % IV SOLN
INTRAVENOUS | Status: DC
Start: 2021-03-17 — End: 2021-03-17

## 2021-03-17 MED ORDER — HYDROCHLOROTHIAZIDE 25 MG PO TABS
25.0000 mg | ORAL_TABLET | Freq: Every day | ORAL | Status: DC
  Administered 2021-03-17 – 2021-03-18 (×2): 25 mg via ORAL
  Filled 2021-03-17 (×2): qty 1

## 2021-03-17 MED ORDER — SUGAMMADEX SODIUM 200 MG/2ML IV SOLN
INTRAVENOUS | Status: DC | PRN
  Administered 2021-03-17: 200 mg via INTRAVENOUS

## 2021-03-17 MED ORDER — SODIUM CHLORIDE 0.9 % IV SOLN
2.0000 g | INTRAVENOUS | Status: DC
  Administered 2021-03-17 – 2021-03-18 (×2): 2 g via INTRAVENOUS
  Filled 2021-03-17 (×2): qty 2
  Filled 2021-03-17: qty 20

## 2021-03-17 MED ORDER — LISINOPRIL-HYDROCHLOROTHIAZIDE 20-25 MG PO TABS: 1.0000 | ORAL_TABLET | Freq: Every day | ORAL | Status: DC

## 2021-03-17 MED ORDER — OXYCODONE HCL 5 MG PO TABS: 5.0000 mg | ORAL_TABLET | Freq: Once | ORAL | Status: DC | PRN

## 2021-03-17 MED ORDER — AMLODIPINE BESYLATE 5 MG PO TABS
5.0000 mg | ORAL_TABLET | Freq: Every day | ORAL | Status: DC
  Administered 2021-03-17 – 2021-03-18 (×2): 5 mg via ORAL
  Filled 2021-03-17 (×2): qty 1

## 2021-03-17 MED ORDER — ATORVASTATIN CALCIUM 20 MG PO TABS
40.0000 mg | ORAL_TABLET | Freq: Every day | ORAL | Status: DC
  Administered 2021-03-17 – 2021-03-18 (×2): 40 mg via ORAL
  Filled 2021-03-17 (×2): qty 2

## 2021-03-17 MED ORDER — PIPERACILLIN-TAZOBACTAM 3.375 G IVPB 30 MIN
3.3750 g | Freq: Once | INTRAVENOUS | Status: AC
  Administered 2021-03-17: 3.375 g via INTRAVENOUS
  Filled 2021-03-17: qty 50

## 2021-03-17 MED ORDER — DEXAMETHASONE SODIUM PHOSPHATE 10 MG/ML IJ SOLN
INTRAMUSCULAR | Status: DC | PRN
  Administered 2021-03-17: 10 mg via INTRAVENOUS

## 2021-03-17 MED ORDER — FENTANYL CITRATE (PF) 100 MCG/2ML IJ SOLN
INTRAMUSCULAR | Status: DC | PRN
  Administered 2021-03-17: 100 ug via INTRAVENOUS
  Administered 2021-03-17 (×2): 50 ug via INTRAVENOUS

## 2021-03-17 MED ORDER — HEPARIN SODIUM (PORCINE) 5000 UNIT/ML IJ SOLN
5000.0000 [IU] | Freq: Three times a day (TID) | INTRAMUSCULAR | Status: DC
  Administered 2021-03-18: 5000 [IU] via SUBCUTANEOUS
  Filled 2021-03-17: qty 1

## 2021-03-17 SURGICAL SUPPLY — 49 items
BLADE SURG 15 STRL LF DISP TIS (BLADE) ×1 IMPLANT
BLADE SURG 15 STRL SS (BLADE) ×1
CANISTER SUCT 1200ML W/VALVE (MISCELLANEOUS) ×2 IMPLANT
CHLORAPREP W/TINT 26 (MISCELLANEOUS) ×2 IMPLANT
CLIP VESOLOCK LG 6/CT PURPLE (CLIP) ×2 IMPLANT
CLIP VESOLOCK MED LG 6/CT (CLIP) ×4 IMPLANT
COVER TIP SHEARS 8 DVNC (MISCELLANEOUS) ×1 IMPLANT
COVER TIP SHEARS 8MM DA VINCI (MISCELLANEOUS) ×1
COVER WAND RF STERILE (DRAPES) ×2 IMPLANT
DECANTER SPIKE VIAL GLASS SM (MISCELLANEOUS) ×2 IMPLANT
DEFOGGER SCOPE WARMER CLEARIFY (MISCELLANEOUS) ×2 IMPLANT
DERMABOND ADVANCED (GAUZE/BANDAGES/DRESSINGS) ×1
DERMABOND ADVANCED .7 DNX12 (GAUZE/BANDAGES/DRESSINGS) ×1 IMPLANT
DRAPE ARM DVNC X/XI (DISPOSABLE) ×4 IMPLANT
DRAPE COLUMN DVNC XI (DISPOSABLE) ×1 IMPLANT
DRAPE DA VINCI XI ARM (DISPOSABLE) ×4
DRAPE DA VINCI XI COLUMN (DISPOSABLE) ×1
ELECT CAUTERY BLADE 6.4 (BLADE) ×2 IMPLANT
GLOVE ORTHO TXT STRL SZ7.5 (GLOVE) ×4 IMPLANT
GOWN STRL REUS W/ TWL LRG LVL3 (GOWN DISPOSABLE) ×4 IMPLANT
GOWN STRL REUS W/TWL LRG LVL3 (GOWN DISPOSABLE) ×4
GRASPER SUT TROCAR 14GX15 (MISCELLANEOUS) IMPLANT
INFUSOR MANOMETER BAG 3000ML (MISCELLANEOUS) IMPLANT
IRRIGATION STRYKERFLOW (MISCELLANEOUS) ×2 IMPLANT
IRRIGATOR STRYKERFLOW (MISCELLANEOUS) ×4
IRRIGATOR SUCT 8 DISP DVNC XI (IRRIGATION / IRRIGATOR) IMPLANT
IRRIGATOR SUCTION 8MM XI DISP (IRRIGATION / IRRIGATOR)
IV NS IRRIG 3000ML ARTHROMATIC (IV SOLUTION) ×2 IMPLANT
KIT PINK PAD W/HEAD ARE REST (MISCELLANEOUS) ×2
KIT PINK PAD W/HEAD ARM REST (MISCELLANEOUS) ×1 IMPLANT
KIT TURNOVER KIT A (KITS) ×2 IMPLANT
LABEL OR SOLS (LABEL) ×2 IMPLANT
MANIFOLD NEPTUNE II (INSTRUMENTS) ×2 IMPLANT
NEEDLE HYPO 22GX1.5 SAFETY (NEEDLE) ×2 IMPLANT
NEEDLE INSUFFLATION 14GA 120MM (NEEDLE) IMPLANT
NS IRRIG 500ML POUR BTL (IV SOLUTION) ×2 IMPLANT
PACK LAP CHOLECYSTECTOMY (MISCELLANEOUS) ×2 IMPLANT
PENCIL ELECTRO HAND CTR (MISCELLANEOUS) ×2 IMPLANT
POUCH SPECIMEN RETRIEVAL 10MM (ENDOMECHANICALS) ×2 IMPLANT
SEAL CANN UNIV 5-8 DVNC XI (MISCELLANEOUS) ×4 IMPLANT
SEAL XI 5MM-8MM UNIVERSAL (MISCELLANEOUS) ×4
SET TUBE SMOKE EVAC HIGH FLOW (TUBING) ×2 IMPLANT
SLEEVE PROTECTION STRL DISP (MISCELLANEOUS) ×2 IMPLANT
SOLUTION ELECTROLUBE (MISCELLANEOUS) ×2 IMPLANT
SUT MNCRL 4-0 (SUTURE) ×2
SUT MNCRL 4-0 27XMFL (SUTURE) ×2
SUT VICRYL 0 AB UR-6 (SUTURE) ×2 IMPLANT
SUTURE MNCRL 4-0 27XMF (SUTURE) ×2 IMPLANT
TROCAR Z-THREAD FIOS 11X100 BL (TROCAR) ×2 IMPLANT

## 2021-03-17 NOTE — H&P (Signed)
[facility] SURGICAL HISTORY & PHYSICAL (cpt [id number])  HISTORY OF PRESENT ILLNESS (HPI):  55 y.o. male presented to [facility] ED this morning for chest pain. Patient reports he noticed the acute onset of epigastric abdominal pain radiating to the RUQ yesterday evening around 5PM. This is a sharp aching pain which at the worst was rated 8/10. He has had no relief from this since the onset. He has associated nausea. He denied any fever, chills, cough, CP, SOB, emesis, or bowel changes. No history of similar. He does endorse a previous umbilical and inguinal hernia repair, but unsure when this was. Work up in the ED revealed normal WBC 10.3K, renal function normal with sCr - 0.79, very mild hyperbilirubinemia to 1.4, and CT Abdomen/Pelvis was concerning for large gallstone in the neck of a distended gallbladder concerning for acute cholecystitis.   General surgery is consulted by emergency medicine physician Dr Rochele Raring, DO for evaluation and management of acute cholecystitis.    PAST MEDICAL HISTORY (PMH):  No past medical history on file.  Reviewed. Otherwise negative.   PAST SURGICAL HISTORY (PSH):  No past surgical history on file.  Reviewed. Otherwise negative.   MEDICATIONS:  Prior to Admission medications   Medication Sig Start Date End Date Taking? Authorizing Provider  amLODipine (NORVASC) 5 MG tablet Take 5 mg by mouth daily.    [provider]  atorvastatin (LIPITOR) 40 MG tablet Take 40 mg by mouth daily.    [provider]  cyclobenzaprine (FLEXERIL) 5 MG tablet Take 1 tablet (5 mg total) by mouth 3 (three) times daily as needed. 05/06/20   Menshew, Charlesetta Ivory, PA-C  diclofenac (VOLTAREN) 50 MG EC tablet Take 1 tablet (50 mg total) by mouth 2 (two) times daily. 05/06/20   Menshew, Charlesetta Ivory, PA-C  lisinopril-hydrochlorothiazide (ZESTORETIC) 20-25 MG tablet Take 1 tablet by mouth daily.    [provider]     ALLERGIES:  Allergies   Allergen Reactions  . Morphine And Related Nausea And Vomiting     SOCIAL HISTORY:  Social History   Socioeconomic History  . Marital status: Married    Spouse name: Not on file  . Number of children: Not on file  . Years of education: Not on file  . Highest education level: Not on file  Occupational History  . Not on file  Tobacco Use  . Smoking status: Never Smoker  . Smokeless tobacco: Never Used  Substance and Sexual Activity  . Alcohol use: Not Currently  . Drug use: Not on file  . Sexual activity: Not on file  Other Topics Concern  . Not on file  Social History Narrative  . Not on file   Social Determinants of Health   Financial Resource Strain: Not on file  Food Insecurity: Not on file  Transportation Needs: Not on file  Physical Activity: Not on file  Stress: Not on file  Social Connections: Not on file  Intimate Partner Violence: Not on file     FAMILY HISTORY:  No family history on file.  Otherwise negative.   REVIEW OF SYSTEMS:  Review of Systems  Constitutional: Negative for chills and fever.  HENT: Negative for congestion and sore throat.   Respiratory: Negative for cough and shortness of breath.   Cardiovascular: Negative for chest pain and palpitations.  Gastrointestinal: Positive for abdominal pain and nausea. Negative for diarrhea and vomiting.  Genitourinary: Negative for dysuria and urgency.  All other systems reviewed and are negative.  VITAL SIGNS:  Temp:  [97.7 F (36.5 C)-98.2 F (36.8 C)] 98.2 F (36.8 C) (03/21 0726) Pulse Rate:  [75-94] 91 (03/21 0726) Resp:  [18-33] 18 (03/21 0726) BP: (136-170)/(87-101) 148/101 (03/21 0726) SpO2:  [94 %-99 %] 99 % (03/21 0726) Weight:  [89.4 kg] 89.4 kg (03/21 0252)     Height: 5\' 11"  (180.3 cm) Weight: 89.4 kg BMI (Calculated): 27.49   PHYSICAL EXAM:  Physical Exam Vitals and nursing note reviewed.  Constitutional:      General: He is not in acute distress.    Appearance: He is  well-developed. He is obese. He is not ill-appearing.  HENT:     Head: Normocephalic and atraumatic.  Eyes:     General: No scleral icterus.    Conjunctiva/sclera: Conjunctivae normal.  Cardiovascular:     Rate and Rhythm: Normal rate and regular rhythm.     Pulses: Normal pulses.     Heart sounds: No murmur heard.   Pulmonary:     Effort: Pulmonary effort is normal. No respiratory distress.  Abdominal:     General: Abdomen is flat. There is no distension.     Palpations: Abdomen is soft.     Tenderness: There is abdominal tenderness in the right upper quadrant and epigastric area. There is no guarding or rebound. Negative signs include Murphy's sign.     Comments: Abdomen is soft, he is visibly tender in the epigastrium and RUQ, non-distended, no rebound/guarding, Negative Murphy's Sign on my examination  Genitourinary:    Comments: Defered Musculoskeletal:     Right lower leg: No edema.     Left lower leg: No edema.  Skin:    General: Skin is warm and dry.     Coloration: Skin is not pale.     Findings: No erythema.  Neurological:     General: No focal deficit present.     Mental Status: He is alert and oriented to person, place, and time.  Psychiatric:        Mood and Affect: Mood normal.        Behavior: Behavior normal.     INTAKE/OUTPUT:  This shift: No intake/output data recorded.  Last 2 shifts: @IOLAST2SHIFTS @  Labs:  CBC Latest Ref Rng & Units 03/17/2021  WBC 4.0 - 10.5 K/uL 10.3  Hemoglobin 13.0 - 17.0 g/dL  Hematocrit 03/19/2021 - 52.0 % 41.2  Platelets 150 - 400 K/uL 263   CMP Latest Ref Rng & Units 03/17/2021  Glucose 70 - 99 mg/dL 17.4)  BUN 6 - 20 mg/dL 15  Creatinine 03/19/2021 - 944(H mg/dL 6.75  Sodium 9.16 - 3.84 mmol/L 135  Potassium 3.5 - 5.1 mmol/L 3.6  Chloride 98 - 111 mmol/L 101  CO2 22 - 32 mmol/L 22  Calcium 8.9 - 10.3 mg/dL 9.3  Total Protein 6.5 - 8.1 g/dL 8.0  Total Bilirubin 0.3 - 1.2 mg/dL 665)  Alkaline Phos 38 - 126 U/L 55  AST 15  - 41 U/L 29  ALT 0 - 44 U/L 24    Imaging studies:   CT Abdomen/Pelvis (03/18/2021) personally reviewed showing large stone in the neck of distended gallbladder, and radiologist report reviewed below:  IMPRESSION: 1. Positive for evidence of Acute Cholecystitis: dilated gallbladder with a relatively large 2 cm stone in the neck. No superimposed bile duct enlargement. 2. No other acute or inflammatory process identified in the abdomen or pelvis. Visible lung base opacity more resembles atelectasis than infection. 3. Developing left lower pole staghorn renal  calculus, currently 16 mm. No obstructive uropathy. 4. Diverticulosis of the large bowel. Fat containing hiatal and left inguinal hernias.   Assessment/Plan: (ICD-10's: K81.0) 55 y.o. male with RUQ abdominal pain found to have a large gallstone in neck of distended gallbladder concerning for possible cholecystitis   - Admit to general surgery  - Plan for robotic assisted laparoscopic cholecystectomy with Dr Claudine Mouton this afternoon pending OR/Anesthesia availability  - All risks, benefits, and alternatives to above procedure(s) were discussed with the patient (and daughter at bedside), all of his questions were answered to he expressed satisfaction, patient expresses he wishes to proceed, and informed consent was obtained.   - NPO + IVF Resuscitation  - IV ABx (Rocephin)  - Monitor abdominal examination  - Pain control prn; antiemetics prn   - mobilization as tolerated   - DVT prophylaxis; hold for OR  All of the above findings and recommendations were discussed with the patient and his daughter at bedside, and all of their questions were answered to their expressed satisfaction.  -- Lynden Oxford, PA-C [facility] 03/17/2021, 7:54 AM [phone] M-F: 7am - 4pm

## 2021-03-17 NOTE — ED Notes (Signed)
Provided urinal for patient.

## 2021-03-17 NOTE — ED Notes (Signed)
Report given to Brianna, RN

## 2021-03-17 NOTE — Anesthesia Preprocedure Evaluation (Signed)
Anesthesia Evaluation  Patient identified by MRN, date of birth, ID band Patient awake  General Assessment Comment:Large gallstone. Patient febrile today and tachycardic. Feels systemically well. Did have some vomiting today.  Reviewed: Allergy & Precautions, NPO status , Patient's Chart, lab work & pertinent test results  History of Anesthesia Complications Negative for: history of anesthetic complications  Airway Mallampati: II  TM Distance: >3 FB Neck ROM: Full    Dental no notable dental hx. (+) Teeth Intact   Pulmonary neg pulmonary ROS, neg sleep apnea, neg COPD, Patient abstained from smoking.Not current smoker,    Pulmonary exam normal breath sounds clear to auscultation       Cardiovascular Exercise Tolerance: Good METShypertension, (-) CAD and (-) Past MI (-) dysrhythmias  Rhythm:Regular Rate:Tachycardia - Systolic murmurs    Neuro/Psych negative neurological ROS  negative psych ROS   GI/Hepatic neg GERD  ,(+)     (-) substance abuse  ,   Endo/Other  neg diabetes  Renal/GU negative Renal ROS     Musculoskeletal   Abdominal   Peds  Hematology   Anesthesia Other Findings History reviewed. No pertinent past medical history.  Reproductive/Obstetrics                             Anesthesia Physical Anesthesia Plan  ASA: III  Anesthesia Plan: General   Post-op Pain Management:    Induction: Intravenous  PONV Risk Score and Plan: 3 and Ondansetron, Dexamethasone and Treatment may vary due to age or medical condition  Airway Management Planned: Oral ETT  Additional Equipment: None  Intra-op Plan:   Post-operative Plan: Extubation in OR  Informed Consent: I have reviewed the patients History and Physical, chart, labs and discussed the procedure including the risks, benefits and alternatives for the proposed anesthesia with the patient or authorized representative who has  indicated his/her understanding and acceptance.     Dental advisory given  Plan Discussed with: CRNA and Surgeon  Anesthesia Plan Comments: (Discussed risks of anesthesia with patient, including PONV, sore throat, lip/dental damage. Rare risks discussed as well, such as cardiorespiratory and neurological sequelae. Patient understands.)        Anesthesia Quick Evaluation

## 2021-03-17 NOTE — Anesthesia Procedure Notes (Signed)
Procedure Name: Intubation Date/Time: 03/17/2021 3:24 PM Performed by: Hezzie Bump, CRNA Pre-anesthesia Checklist: Patient identified, Patient being monitored, Timeout performed, Emergency Drugs available and Suction available Patient Re-evaluated:Patient Re-evaluated prior to induction Oxygen Delivery Method: Circle system utilized Preoxygenation: Pre-oxygenation with 100% oxygen Induction Type: IV induction Ventilation: Mask ventilation without difficulty Laryngoscope Size: 3 and McGraph Grade View: Grade I Tube type: Oral Tube size: 7.0 mm Number of attempts: 1 Airway Equipment and Method: Stylet Placement Confirmation: ETT inserted through vocal cords under direct vision,  positive ETCO2 and breath sounds checked- equal and bilateral Secured at: 24 cm Tube secured with: Tape Dental Injury: Teeth and Oropharynx as per pre-operative assessment

## 2021-03-17 NOTE — ED Provider Notes (Signed)
[facility] Emergency Department Provider Note  ____________________________________________   Event Date/Time   First MD Initiated Contact with Patient 03/17/21 [phone]     (approximate)  I have reviewed the triage vital signs and the nursing notes.   HISTORY  Chief Complaint Chest Pain    HPI Jones Daryl is a 55 y.o. male with history of obesity, hypertension who presents to the emergency department complaints of sharp, severe upper abdominal pain that started tonight after eating steak, baked potato and drinking beer.  States he ate dinner around 5 PM and then when he went to lay down around 6:30 PM is when he started having pain.  No radiation of pain.  He denies any chest pain but states he does feel short of breath.  Has had nausea and vomiting.  No diarrhea, bloody stools or melena.  No dysuria or hematuria.  Has never had similar symptoms.  No known history of pancreatitis.  No known history of gallstones.  He has had previous hernia operation.  States he does drink intermittently but not every day and has never had alcohol withdrawal symptoms.        No past medical history on file.  There are no problems to display for this patient.   No past surgical history on file.  Prior to Admission medications   Medication Sig Start Date End Date Taking? Authorizing Provider  amLODipine (NORVASC) 5 MG tablet Take 5 mg by mouth daily.    [provider]  atorvastatin (LIPITOR) 40 MG tablet Take 40 mg by mouth daily.    [provider]  cyclobenzaprine (FLEXERIL) 5 MG tablet Take 1 tablet (5 mg total) by mouth 3 (three) times daily as needed. 05/06/20   Menshew, Daryl Ivory, PA-C  diclofenac (VOLTAREN) 50 MG EC tablet Take 1 tablet (50 mg total) by mouth 2 (two) times daily. 05/06/20   Menshew, Daryl Ivory, PA-C  lisinopril-hydrochlorothiazide (ZESTORETIC) 20-25 MG tablet Take 1 tablet by mouth daily.    [provider]     Allergies Morphine and related  No family history on file.  Social History Social History   Tobacco Use  . Smoking status: Never Smoker  . Smokeless tobacco: Never Used  Substance Use Topics  . Alcohol use: Not Currently    Review of Systems Constitutional: No fever. Eyes: No visual changes. ENT: No sore throat. Cardiovascular: Denies chest pain. Respiratory: + shortness of breath. Gastrointestinal: +  nausea, vomiting.  No diarrhea. Genitourinary: Negative for dysuria. Musculoskeletal: Negative for back pain. Skin: Negative for rash. Neurological: Negative for focal weakness or numbness.  ____________________________________________   PHYSICAL EXAM:  VITAL SIGNS: ED Triage Vitals  Enc Vitals Group     BP 03/17/21 0251 136/87     Pulse Rate 03/17/21 0251 77     Resp 03/17/21 0244 (!) 24     Temp 03/17/21 0251 97.7 F (36.5 C)     Temp Source 03/17/21 0251 Oral     SpO2 03/17/21 0251 98 %     Weight 03/17/21 0252 197 lb (89.4 kg)     Height 03/17/21 0252 5\' 11"  (1.803 m)     Head Circumference --      Peak Flow --      Pain Score 03/17/21 0252 8     Pain Loc --      Pain Edu? --      Excl. in GC? --    CONSTITUTIONAL: Alert and oriented and  responds appropriately to questions.  Obese.  Appears uncomfortable. HEAD: Normocephalic EYES: Conjunctivae clear, pupils appear equal, EOM appear intact ENT: normal nose; moist mucous membranes NECK: Supple, normal ROM CARD: RRR; S1 and S2 appreciated; no murmurs, no clicks, no rubs, no gallops RESP: Intermittently tachypneic and splinting, breath sounds clear and equal bilaterally; no wheezes, no rhonchi, no rales, no hypoxia or respiratory distress, speaking full sentences ABD/GI: Normal bowel sounds; non-distended; soft, diffusely tender throughout the upper abdomen with intermittent guarding, no rebound BACK: The back appears normal EXT: Normal ROM in all joints; no deformity noted, no edema; no cyanosis SKIN:  Normal color for age and race; warm; no rash on exposed skin NEURO: Moves all extremities equally PSYCH: The patient's mood and manner are appropriate.  ____________________________________________   LABS (all labs ordered are listed, but only abnormal results are displayed)  Labs Reviewed  BASIC METABOLIC PANEL - Abnormal; Notable for the following components:      Result Value   Glucose, Bld 128 (*)    All other components within normal limits  HEPATIC FUNCTION PANEL - Abnormal; Notable for the following components:   Total Bilirubin 1.4 (*)    Indirect Bilirubin 1.3 (*)    All other components within normal limits  URINALYSIS, ROUTINE W REFLEX MICROSCOPIC - Abnormal; Notable for the following components:   Color, Urine YELLOW (*)    APPearance CLEAR (*)    Hgb urine dipstick SMALL (*)    All other components within normal limits  RESP PANEL BY RT-PCR (FLU A&B, COVID) ARPGX2  CBC  LIPASE, BLOOD  BRAIN NATRIURETIC PEPTIDE  ETHANOL  TROPONIN I (HIGH SENSITIVITY)  TROPONIN I (HIGH SENSITIVITY)   ____________________________________________  EKG   EKG Interpretation  Date/Time:  Monday March 17 2021 02:47:09 EDT Ventricular Rate:  70 PR Interval:  194 QRS Duration: 98 QT Interval:  400 QTC Calculation: 432 R Axis:   66 Text Interpretation: Normal sinus rhythm Normal ECG No old tracing to compare Confirmed by Rochele Raring [phone]) on 03/17/2021 2:54:30 AM       ____________________________________________  RADIOLOGY Normajean Baxter Dung Salinger, personally viewed and evaluated these images (plain radiographs) as part of my medical decision making, as well as reviewing the written report by the radiologist.  ED MD interpretation: Acute cholecystitis.  Official radiology report(s): DG Chest 1 View  Result Date: 03/17/2021 CLINICAL DATA:  Chest pain and shortness of breath for 2 hours EXAM: CHEST  1 VIEW COMPARISON:  None. FINDINGS: Some coarse interstitial and hazy opacities  are present the mid to lower lungs and perihilar regions with pulmonary vascular congestion. No pneumothorax. No visible effusion. Cardiomediastinal contours are unremarkable for portable technique. No acute osseous or soft tissue abnormality. Degenerative changes are present in the imaged spine and shoulders. Telemetry leads overlie the chest. IMPRESSION: Coarse interstitial and hazy opacities could reflect atelectasis, edema or atypical infection in the appropriate clinical setting. Electronically Signed   By: Kreg Shropshire M.D.   On: 03/17/2021 03:12   CT ABDOMEN PELVIS W CONTRAST  Result Date: 03/17/2021 CLINICAL DATA:  55 year old male with chest pain, shortness of breath, left upper quadrant abdominal pain. Appears and distress. EXAM: CT ABDOMEN AND PELVIS WITH CONTRAST TECHNIQUE: Multidetector CT imaging of the abdomen and pelvis was performed using the standard protocol following bolus administration of intravenous contrast. CONTRAST:  OMNIPAQUE IOHEXOL 300 MG/ML  SOLN COMPARISON:  Portable chest x-ray earlier today. FINDINGS: Lower chest: Mild respiratory motion. Mostly dependent opacity at both lung bases  which more resembles atelectasis than infection. No pleural effusion. Cardiac size at the upper limits of normal. No pericardial effusion. Hepatobiliary: The gallbladder is enlarged and segments of the gallbladder wall appear indistinct (sagittal image 51) with a 2 cm hyperdense stone in the gallbladder neck (coronal image 53). No superimposed bile duct enlargement. Liver enhancement remains within normal limits. Pancreas: Negative. Spleen: Negative. Adrenals/Urinary Tract: Normal adrenal glands. Negative right kidney and right ureter. There is a left lower pole developing staghorn renal calculus which is triangular and about 16 mm in size currently. But no left hydronephrosis. Decompressed and negative left ureter. Symmetric renal enhancement and contrast excretion. Diminutive and unremarkable  urinary bladder. Numerous pelvic phleboliths incidentally noted. Stomach/Bowel: Redundant sigmoid colon with diverticulosis from the splenic flexure to the mid sigmoid. No active inflammation identified. Negative rectum. Redundant right colon. Normal appendix on series 2, image 52. Flocculated material in the terminal ileum which otherwise appears negative. No dilated small bowel. Small fat containing hiatal hernia. Stomach is mildly to moderately distended with air and food debris. Duodenum is decompressed and negative. No free air. No free fluid. No mesenteric inflammation identified. Vascular/Lymphatic: Major arterial structures are patent. Iliofemoral calcified atherosclerosis, aorta largely spared. Portal venous system is patent. No lymphadenopathy. Reproductive: Fat containing left inguinal hernia (series 2, image 86). Otherwise negative. Other: No pelvic free fluid. Musculoskeletal: No acute osseous abnormality identified. Advanced facet degeneration at the lumbosacral junction. IMPRESSION: 1. Positive for evidence of Acute Cholecystitis: dilated gallbladder with a relatively large 2 cm stone in the neck. No superimposed bile duct enlargement. 2. No other acute or inflammatory process identified in the abdomen or pelvis. Visible lung base opacity more resembles atelectasis than infection. 3. 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 No obstructive uropathy. 4. Diverticulosis of the large bowel. Fat containing hiatal and left inguinal hernias. Electronically Signed   By: Odessa FlemingH  Hall M.D.   On: 03/17/2021 05:12    ____________________________________________   PROCEDURES  Procedure(s) performed (including Critical Care):  Procedures  ____________________________________________   INITIAL IMPRESSION / ASSESSMENT AND PLAN / ED COURSE  As part of my medical decision making, I reviewed the following data within the electronic MEDICAL RECORD NUMBER Nursing notes reviewed and  incorporated, Labs reviewed , EKG interpreted , Old EKG reviewed, Old chart reviewed, A consult was requested and obtained from this/these consultant(s) Surgery and Notes from prior ED visits         Patient here with complaints of upper abdominal pain that is causing him to feel short of breath.  Differential includes pancreatitis, cholecystitis, cholangitis, choledocholithiasis, biliary colic, gastritis, GERD, colitis, ACS, PE, dissection.  Doubt appendicitis.  Will obtain labs, urine, CT abdomen pelvis.  Will give IV pain and nausea medicine.  ED PROGRESS  Patient's labs reassuring.  Minimally elevated total bilirubin but otherwise normal.  No leukocytosis.  Normal lipase.  Troponin x2 normal and flat.  CT scan positive for acute cholecystitis with a dilated gallbladder with 2 similar hyperdense stone in the gallbladder neck.  Will give dose of IV Zosyn here and discussed with general surgery.  5:55 AM  Spoke with Dr. Claudine Moutonodenberg with general surgery.  He will see patient for admission.  We will keep patient n.p.o. and continue IV fluids.  Patient has been updated with plan and is comfortable with proceeding with surgical intervention.   I reviewed all nursing notes and pertinent previous records as available.  I have reviewed and interpreted any EKGs, lab and urine  results, imaging (as available).  ____________________________________________   FINAL CLINICAL IMPRESSION(S) / ED DIAGNOSES  Final diagnoses:  Acute cholecystitis     ED Discharge Orders    None      *Please note:  SAAGAR TORTORELLA was evaluated in Emergency Department on 03/17/2021 for the symptoms described in the history of present illness. He was evaluated in the context of the global COVID-19 pandemic, which necessitated consideration that the patient might be at risk for infection with the SARS-CoV-2 virus that causes COVID-19. Institutional protocols and algorithms that pertain to the evaluation of patients at risk  for COVID-19 are in a state of rapid change based on information released by regulatory bodies including the CDC and federal and state organizations. These policies and algorithms were followed during the patient's care in the ED.  Some ED evaluations and interventions may be delayed as a result of limited staffing during and the pandemic.*   Note:  This document was prepared using Dragon voice recognition software and may include unintentional dictation errors.   Devaunte Gasparini, Layla Maw, DO 03/17/21 [phone]

## 2021-03-17 NOTE — Plan of Care (Signed)

## 2021-03-17 NOTE — Progress Notes (Signed)
PATIENT IS MORE RELAXED AND BREATHING IS MORE CONTROLLED AND NOT AS LABORED. PATIENT IS RESTING COMFORTABLY AT THIS TIME. REMAINS TACHYCARDIC AT 112 BPM. DR Claudine Mouton AND DR ADAMS AWARE, PATIENT WAS TACHY BEFORE SURGERY. BLADDER SCAN WAS 265 AND NO FURTHER INTERVENTION WAS ORDERED BY DR ADAMS FOR THE REDUCED AMOUNT OF URINE. DUONEB IS IMPROVING PATIENT BREATHING.

## 2021-03-17 NOTE — ED Triage Notes (Addendum)
Pt arrives with central chest pain and shob for two hours. Pt appears in distress. Pt states has felt dizzy and maybe nauseated. Pt cannot get comfortable, grunting.

## 2021-03-17 NOTE — Op Note (Signed)
Robotic cholecystectomy  Pre-operative Diagnosis: Acute calculus cholecystitis  Post-operative Diagnosis:  Same.  Procedure: Robotic assisted laparoscopic cholecystectomy.  Surgeon: Campbell Lerner, M.D., FACS  Anesthesia: General. with endotracheal tube  Findings: Edematous distended GB,   Estimated Blood Loss: 25 mL         Drains: None         Specimens: Gallbladder           Complications: none  Procedure Details  The patient was seen again in the Holding Room.  2.5 mg dose of ICG was administered intravenously pre-op.  The benefits, complications, treatment options, risks and expected outcomes were discussed with the patient. The likelihood of improving the patient's symptoms with return to their baseline status is good.  The patient and/or family concurred with the proposed plan, giving informed consent, again alternatives reviewed.  The patient was taken to Operating Room, identified, and the procedure verified as robotic assisted laparoscopic cholecystectomy.  Prior to the induction of general anesthesia, antibiotic prophylaxis was administered. VTE prophylaxis was in place. General endotracheal anesthesia was then administered and tolerated well. The patient was positioned in the supine position.  After the induction, the abdomen was prepped with Chloraprep and draped in the sterile fashion.  A Time Out was held and the above information confirmed. After local infiltration of quarter percent Marcaine with epinephrine, stab incision was made left upper quadrant.  Just below the costal margin approximately midclavicular line the Veres needle is passed with sensation of the layers to penetrate the abdominal wall and into the peritoneum.  Saline drop test is confirmed peritoneal placement.  Insufflation is initiated with carbon dioxide to pressures of 15 mmHg.  Right infra-umbilical local infiltration with quarter percent Marcaine with epinephrine is utilized.  Made a 12 mm incision  on the left periumbilical site, I advanced an optical 69mm port under direct visualization into the peritoneal cavity.  Once the peritoneum was penetrated, insufflation was transferred.  The trocar was then advanced into the abdominal cavity under direct visualization. Pneumoperitoneum was then continued with CO2 at 14 mmHg or less and tolerated well without any adverse changes in the patient's vital signs.  Two 8.5-mm ports were placed in the left lower quadrant and laterally, and one to the right lower quadrant, all under direct vision. All skin incisions  were infiltrated with a local anesthetic agent before making the incision and placing the trocars.  The patient was positioned  in reverse Trendelenburg, tilted the patient's left side down.  Da Vinci XI robot was then positioned on to the patient's left side, and docked.  The gallbladder was identified, the fundus grasped via the arm 4 Prograsp and retracted cephalad. Adhesions were lysed with scissors and cautery. Eventually the retracting caused a leak of bile which decompressed the GB for continued retraction.  The infundibulum was identified grasped, dissected from adjacent tissues and retracted laterally, exposing the peritoneum overlying the triangle of Calot. This was then opened and dissected using cautery & scissors. An extended critical view of the cystic duct and cystic artery was obtained, aided by the ICG via FireFly showed the hepatic parenchyma, but never saw a glowing CHD,CBD, nor cystic duct.    The cystic duct was clearly identified and dissected to isolation, right at it's junction with the GB neck.   Artery well isolated and clipped, and the cystic duct was triple clipped and divided with scissors, leaving two on the remaining stump.  I had to use the extra-large clips due to  the dilated GB neck CD junction.  The specimen side of the artery is sealed with bipolar and divided with monopolar scissors.   The gallbladder was taken from  the gallbladder fossa in a retrograde fashion with the electrocautery. The gallbladder was removed and placed in an Endocatch bag.  The liver bed is inspected. Hemostasis was confirmed.  The robot was undocked and moved away from the operative field. 3 Liters of NSS irrigation was utilized and was aspirated clear.  The gallbladder and Endocatch sac were then removed through the infraumbilical port site.   Inspection of the right upper quadrant was performed. No bleeding, bile duct injury or leak, or bowel injury was noted. The infra-umbilical port site fascia was closed with interrumpted 0 Vicryl sutures using PMI/cone under direct visualization. Pneumoperitoneum was released and ports removed.  4-0 subcuticular Monocryl was used to close the skin. Dermabond was  applied.  The patient was then extubated and brought to the recovery room in stable condition. Sponge, lap, and needle counts were correct at closure and at the conclusion of the case.               Campbell Lerner, M.D., [facility] 03/17/2021 5:30 PM

## 2021-03-17 NOTE — Transfer of Care (Signed)
Immediate Anesthesia Transfer of Care Note  Patient: Daryl Jones  Procedure(s) Performed: XI ROBOTIC ASSISTED LAPAROSCOPIC CHOLECYSTECTOMY (N/A Abdomen)  Patient Location: PACU  Anesthesia Type:General  Level of Consciousness: awake and alert   Airway & Oxygen Therapy: Patient Spontanous Breathing and Patient connected to face mask oxygen  Post-op Assessment: Report given to RN and Post -op Vital signs reviewed and stable  Post vital signs: Reviewed and stable  Last Vitals:  Vitals Value Taken Time  BP 139/92 03/17/21 1737  Temp    Pulse 108 03/17/21 1741  Resp 22 03/17/21 1741  SpO2 99 % 03/17/21 1741  Vitals shown include unvalidated device data.  Last Pain:  Vitals:   03/17/21 1501  TempSrc: Tympanic  PainSc: 8       Patients Stated Pain Goal: 0 (03/17/21 1501)  Complications: No complications documented.

## 2021-03-17 NOTE — ED Notes (Signed)
Patient reported increased pain. Dr. Elesa Massed notified.

## 2021-03-18 MED ORDER — OXYCODONE HCL 5 MG PO TABS: 5.0000 mg | ORAL_TABLET | Freq: Four times a day (QID) | ORAL | 0 refills | Status: DC | PRN

## 2021-03-18 MED ORDER — IBUPROFEN 800 MG PO TABS: 800.0000 mg | ORAL_TABLET | Freq: Three times a day (TID) | ORAL | 0 refills | Status: DC | PRN

## 2021-03-18 MED ORDER — AMOXICILLIN-POT CLAVULANATE 875-125 MG PO TABS: 1.0000 | ORAL_TABLET | Freq: Two times a day (BID) | ORAL | 0 refills | Status: AC

## 2021-03-18 NOTE — Plan of Care (Addendum)
Pt currently on RA, Vitals stable. Pt able to ambulate to restroom to void. Not passing gas at this time but tolerating clears. Safety measures in place. Will continue to monitor.  Problem: Education: Goal: Knowledge of General Education information will improve Description: Including pain rating scale, medication(s)/side effects and non-pharmacologic comfort measures Outcome: Progressing   Problem: Health Behavior/Discharge Planning: Goal: Ability to manage health-related needs will improve Outcome: Progressing   Problem: Clinical Measurements: Goal: Ability to maintain clinical measurements within normal limits will improve Outcome: Progressing Goal: Will remain free from infection Outcome: Progressing Goal: Diagnostic test results will improve Outcome: Progressing Goal: Respiratory complications will improve Outcome: Progressing Goal: Cardiovascular complication will be avoided Outcome: Progressing   Problem: Activity: Goal: Risk for activity intolerance will decrease Outcome: Progressing   Problem: Nutrition: Goal: Adequate nutrition will be maintained Outcome: Progressing   Problem: Coping: Goal: Level of anxiety will decrease Outcome: Progressing   Problem: Elimination: Goal: Will not experience complications related to bowel motility Outcome: Progressing Goal: Will not experience complications related to urinary retention Outcome: Progressing   Problem: Pain Managment: Goal: General experience of comfort will improve Outcome: Progressing   Problem: Safety: Goal: Ability to remain free from injury will improve Outcome: Progressing   Problem: Skin Integrity: Goal: Risk for impaired skin integrity will decrease Outcome: Progressing

## 2021-03-18 NOTE — Progress Notes (Addendum)
   03/17/21 1958  Assess: MEWS Score  Temp 99.4 F (37.4 C)  BP (!) 147/99  Pulse Rate (!) 103  Resp (!) 22  SpO2 99 %  O2 Device Nasal Cannula  O2 Flow Rate (L/min) 4 L/min  Assess: MEWS Score  MEWS Temp 0  MEWS Systolic 0  MEWS Pulse 1  MEWS RR 1  MEWS LOC 0  MEWS Score 2  MEWS Score Color Yellow  Assess: if the MEWS score is Yellow or Red  Were vital signs taken at a resting state? Yes  Focused Assessment Change from prior assessment (see assessment flowsheet)  Early Detection of Sepsis Score *See Row Information* Low  MEWS guidelines implemented *See Row Information* Yes  Treat  MEWS Interventions Administered scheduled meds/treatments;Administered prn meds/treatments;Escalated (See documentation below) (Toradol administered as well as BP meds)  Take Vital Signs  Increase Vital Sign Frequency  Yellow: Q 2hr X 2 then Q 4hr X 2, if remains yellow, continue Q 4hrs  Escalate  MEWS: Escalate Yellow: discuss with charge nurse/RN and consider discussing with provider and RRT  Notify: Charge Nurse/RN  Name of Charge Nurse/RN Notified Marcella, RN  Date Charge Nurse/RN Notified 03/18/21  Time Charge Nurse/RN Notified 2030  Notify: Provider  Provider Name/Title Thomasenia Bottoms, General Surgeon  Date Provider Notified 03/17/21  Time Provider Notified 2024  Notification Type Page  Notification Reason Change in status (Yellow mews)  Provider response No new orders (noted)  Date of Provider Response 03/17/21  Time of Provider Response 2025  Document  Patient Outcome Stabilized after interventions  Progress note created (see row info) Yes

## 2021-03-18 NOTE — Discharge Instructions (Signed)
In addition to included general post-operative instructions,  Diet: Resume home diet. Recommend avoiding or limiting fatty foods for the first few days/week after surgery. If you do eat these, you may (or may not) notice diarrhea. This is expected as your body adjusts to not having a gallbladder and typically resolves with time.   Activity: No heavy lifting >20 pounds (children, pets, laundry, garbage) for 4 weeks, but light activity and walking are encouraged. Do not drive or drink alcohol if taking narcotic pain medications or having pain that might distract from driving.  Wound care: 2 days after surgery (03/23), you may shower/get incision wet with soapy water and pat dry (do not rub incisions), but no baths or submerging incision underwater until follow-up.   Medications: Resume all home medications. For mild to moderate pain: acetaminophen (Tylenol) or ibuprofen/naproxen (if no kidney disease). Combining Tylenol with alcohol can substantially increase your risk of causing liver disease. Narcotic pain medications, if prescribed, can be used for severe pain, though may cause nausea, constipation, and drowsiness. Do not combine Tylenol and Percocet (or similar) within a 6 hour period as Percocet (and similar) contain(s) Tylenol. If you do not need the narcotic pain medication, you do not need to fill the prescription.  Call office [phone] / [phone]) at any time if any questions, worsening pain, fevers/chills, bleeding, drainage from incision site, or other concerns.

## 2021-03-18 NOTE — Discharge Summary (Addendum)
[facility] SURGICAL DISCHARGE SUMMARY  Patient ID: Daryl Jones MRN: [id number] DOB/AGE: 1966/02/24 55 y.o.  Admit date: 03/17/2021 Discharge date: 03/18/2021  Discharge Diagnoses Patient Active Problem List   Diagnosis Date Noted   Acute cholecystitis 03/17/2021    Consultants None  Procedures 03/17/2021:  Robotic assisted laparoscopic cholecystectomy  HPI: 55 y.o. male presented to [facility] ED this morning for chest pain. Patient reports he noticed the acute onset of epigastric abdominal pain radiating to the RUQ yesterday evening around 5PM. This is a sharp aching pain which at the worst was rated 8/10. He has had no relief from this since the onset. He has associated nausea. He denied any fever, chills, cough, CP, SOB, emesis, or bowel changes. No history of similar. He does endorse a previous umbilical and inguinal hernia repair, but unsure when this was. Work up in the ED revealed normal WBC 10.3K, renal function normal with sCr - 0.79, very mild hyperbilirubinemia to 1.4, and CT Abdomen/Pelvis was concerning for large gallstone in the neck of a distended gallbladder concerning for acute cholecystitis  Hospital Course: Informed consent was obtained and documented, and patient underwent uneventful robotic assisted laparoscopic cholecystectomy (Dr Claudine Mouton, 03/17/2021).  Post-operatively, patient's pain/symptoms improved/resolved and advancement of patient's diet and ambulation were well-tolerated. The remainder of patient's hospital course was essentially unremarkable, and discharge planning was initiated accordingly with patient safely able to be discharged home with appropriate discharge instructions, antibiotics (Augmentin x7 days), pain control, and outpatient follow-up after all of his questions were answered to his expressed satisfaction.   Discharge Condition: Good   Physical Examination:  Constitutional: Well appearing male, NAD Pulmonary: Normal effort, no  respiratory distress Gastrointestinal: Soft, incisional soreness, non-distended, no rebound/guarding Skin: Laparoscopic incisions are CDI with dermabond, no erythema, no drainage    Allergies as of 03/18/2021       Reactions   Morphine Hives, Nausea And Vomiting   Morphine And Related Nausea And Vomiting        Medication List     TAKE these medications    amLODipine 5 MG tablet Commonly known as: NORVASC Take 5 mg by mouth daily.   amoxicillin-clavulanate 875-125 MG tablet Commonly known as: Augmentin Take 1 tablet by mouth 2 (two) times daily for 7 days.   atorvastatin 40 MG tablet Commonly known as: LIPITOR Take 40 mg by mouth daily.   ibuprofen 800 MG tablet Commonly known as: ADVIL Take 1 tablet (800 mg total) by mouth every 8 (eight) hours as needed.   lisinopril-hydrochlorothiazide 20-25 MG tablet Commonly known as: ZESTORETIC Take 1 tablet by mouth daily.   oxyCODONE 5 MG immediate release tablet Commonly known as: Oxy IR/ROXICODONE Take 1 tablet (5 mg total) by mouth every 6 (six) hours as needed for severe pain or breakthrough pain.          Follow-up Information     Donovan Kail, PA-C. Schedule an appointment as soon as possible for a visit in 2 week(s).   Specialty: Physician Assistant Why: s/p robotic assisted laparoscopic cholecystectomy  Contact information: [address] [place] [address] [phone]                  Time spent on discharge management including discussion of hospital course, clinical condition, outpatient instructions, prescriptions, and follow up with the patient and members of the medical team: >30 minutes  -- Lynden Oxford , PA-C [facility]  03/18/2021, 9:17 AM [phone] M-F: 7am - 4pm  The  patient was discharged prior to my evaluation today, however I reviewed his chart and concur with Mr. Jan Fireman documentation above.

## 2021-03-19 LAB — SURGICAL PATHOLOGY

## 2021-03-24 NOTE — Anesthesia Postprocedure Evaluation (Signed)
Anesthesia Post Note  Patient: Daryl Jones  Procedure(s) Performed: XI ROBOTIC ASSISTED LAPAROSCOPIC CHOLECYSTECTOMY (N/A Abdomen)  Patient location during evaluation: PACU Anesthesia Type: General Level of consciousness: awake and alert Pain management: pain level controlled Vital Signs Assessment: post-procedure vital signs reviewed and stable Respiratory status: spontaneous breathing, nonlabored ventilation, respiratory function stable and patient connected to nasal cannula oxygen Cardiovascular status: blood pressure returned to baseline and stable Postop Assessment: no apparent nausea or vomiting Anesthetic complications: no   No complications documented.   Last Vitals:  Vitals:   03/18/21 0800 03/18/21 1139  BP: 101/66 120/77  Pulse: 89 88  Resp: 18 16  Temp: 36.7 C 36.7 C  SpO2: 95% 98%    Last Pain:  Vitals:   03/18/21 1139  TempSrc: Oral  PainSc: 0-No pain                 Yevette Edwards

## 2021-03-31 ENCOUNTER — Telehealth: Payer: Self-pay | Admitting: *Deleted

## 2021-03-31 NOTE — Telephone Encounter (Signed)
Faxed STD to [facility] at [phone]

## 2021-04-01 ENCOUNTER — Other Ambulatory Visit: Payer: Self-pay

## 2021-04-01 ENCOUNTER — Encounter: Payer: Self-pay | Admitting: Surgery

## 2021-04-01 ENCOUNTER — Ambulatory Visit (INDEPENDENT_AMBULATORY_CARE_PROVIDER_SITE_OTHER): Payer: BC Managed Care – PPO | Admitting: Surgery

## 2021-04-01 VITALS — BP 147/91 | HR 98 | Temp 98.8°F | Ht 71.0 in | Wt 242.0 lb

## 2021-04-01 DIAGNOSIS — Z9049 Acquired absence of other specified parts of digestive tract: Secondary | ICD-10-CM

## 2021-04-01 NOTE — Progress Notes (Signed)
[facility] POST-OP OFFICE VISIT  04/01/2021  HPI: Daryl Jones is a 55 y.o. male 15 days s/p robotic cholecystectomy.  Reports the furthest right incision is the most tender.  Otherwise his bowel movements have settled down.  Denies nausea or vomiting, with reasonable pain control. DIAGNOSIS:  A. GALLBLADDER; CHOLECYSTECTOMY:  - ACUTE CALCULOUS CHOLECYSTITIS WITH TRANSMURAL INFLAMMATION AND  SEROSITIS.  - NEGATIVE FOR DYSPLASIA AND MALIGNANCY.   Vital signs: BP (!) 147/91   Pulse 98   Temp 98.8 F (37.1 C)   Ht 5\' 11"  (1.803 m)   Wt 242 lb (109.8 kg)   SpO2 95%   BMI 33.75 kg/m    Physical Exam: Constitutional: Appears well. Abdomen: Soft nontender, nondistended. Skin: Incisions are all healing well.  Assessment/Plan: This is a 55 y.o. male status post robotic cholecystectomy for acute cholecystitis.  Making significant progress.  Patient Active Problem List   Diagnosis Date Noted  . Acute cholecystitis 03/17/2021    -Were anticipating the incisional pain diminishing over time.  We are glad to see him again as needed.  Anticipating 1 month from now to complete convalescence.   03/19/2021 M.D., FACS 04/01/2021, 12:25 PM

## 2021-04-01 NOTE — Patient Instructions (Addendum)
You may return to work on 04/21/2021 with out restrictions.   Follow-up with our office as needed.  Please call and ask to speak with a nurse if you develop questions or concerns.    GENERAL POST-OPERATIVE PATIENT INSTRUCTIONS   WOUND CARE INSTRUCTIONS: If clothing rubs against the wound or causes irritation and the wound is not draining you may cover it with a dry dressing during the daytime.  Try to keep the wound dry and avoid ointments on the wound unless directed to do so.  If the wound becomes bright red and painful or starts to drain infected material that is not clear, please contact your physician immediately.  If the wound is mildly pink and has a thick firm ridge underneath it, this is normal, and is referred to as a healing ridge.  This will resolve over the next 4-6 weeks.  BATHING: You may shower if you have been informed of this by your surgeon. However, Please do not submerge in a tub, hot tub, or pool until incisions are completely sealed or have been told by your surgeon that you may do so.  DIET:  You may eat any foods that you can tolerate.  It is a good idea to eat a high fiber diet and take in plenty of fluids to prevent constipation.  If you do become constipated you may want to take a mild laxative or take ducolax tablets on a daily basis until your bowel habits are regular.  Constipation can be very uncomfortable, along with straining, after recent surgery.  ACTIVITY:  You may want to hug a pillow when coughing and sneezing to add additional support to the surgical area, if you had abdominal or chest surgery, which will decrease pain during these times.  You are encouraged to walk and engage in light activity for the next two weeks.  You should not lift more than 20 pounds for 4-6 weeks as it could put you at increased risk for complications.  Twenty pounds is roughly equivalent to a plastic bag of groceries. At that time- Listen to your body when lifting, if you have pain  when lifting, stop and then try again in a few days. Soreness after doing exercises or activities of daily living is normal as you get back in to your normal routine.  MEDICATIONS:  Try to take narcotic medications and anti-inflammatory medications, such as tylenol, ibuprofen, naprosyn, etc., with food.  This will minimize stomach upset from the medication.  Should you develop nausea and vomiting from the pain medication, or develop a rash, please discontinue the medication and contact your physician.  You should not drive, make important decisions, or operate machinery when taking narcotic pain medication.  SUNBLOCK Use sun block to incision area over the next year if this area will be exposed to sun. This helps decrease scarring and will allow you avoid a permanent darkened area over your incision.  QUESTIONS:  Please feel free to call our office if you have any questions, and we will be glad to assist you.

## 2021-04-02 DIAGNOSIS — Z9049 Acquired absence of other specified parts of digestive tract: Secondary | ICD-10-CM | POA: Insufficient documentation

## 2024-03-16 ENCOUNTER — Ambulatory Visit (INDEPENDENT_AMBULATORY_CARE_PROVIDER_SITE_OTHER): Payer: Self-pay

## 2024-03-16 DIAGNOSIS — K640 First degree hemorrhoids: Secondary | ICD-10-CM | POA: Diagnosis not present

## 2024-03-16 DIAGNOSIS — K5730 Diverticulosis of large intestine without perforation or abscess without bleeding: Secondary | ICD-10-CM | POA: Diagnosis not present

## 2024-03-16 DIAGNOSIS — Z860100 Personal history of colon polyps, unspecified: Secondary | ICD-10-CM | POA: Diagnosis not present

## 2024-03-16 DIAGNOSIS — Z09 Encounter for follow-up examination after completed treatment for conditions other than malignant neoplasm: Secondary | ICD-10-CM | POA: Diagnosis present

## 2024-12-09 ENCOUNTER — Other Ambulatory Visit: Payer: Self-pay

## 2024-12-09 ENCOUNTER — Encounter: Payer: Self-pay | Admitting: Emergency Medicine

## 2024-12-09 LAB — URINALYSIS, ROUTINE W REFLEX MICROSCOPIC
Bacteria, UA: NONE SEEN
Bilirubin Urine: NEGATIVE
Glucose, UA: NEGATIVE mg/dL
Hgb urine dipstick: NEGATIVE
Ketones, ur: NEGATIVE mg/dL
Leukocytes,Ua: NEGATIVE
Nitrite: NEGATIVE
Protein, ur: 30 mg/dL — AB
Specific Gravity, Urine: 1.036 — ABNORMAL HIGH (ref 1.005–1.030)
pH: 5 (ref 5.0–8.0)

## 2024-12-09 LAB — COMPREHENSIVE METABOLIC PANEL WITH GFR
ALT: 21 U/L (ref 0–44)
AST: 29 U/L (ref 15–41)
Albumin: 4.4 g/dL (ref 3.5–5.0)
Alkaline Phosphatase: 61 U/L (ref 38–126)
Anion gap: 15 (ref 5–15)
BUN: 16 mg/dL (ref 6–20)
CO2: 24 mmol/L (ref 22–32)
Calcium: 9.3 mg/dL (ref 8.9–10.3)
Chloride: 98 mmol/L (ref 98–111)
Creatinine, Ser: 0.83 mg/dL (ref 0.61–1.24)
GFR, Estimated: >60 mL/min (ref >60)
Glucose, Bld: 96 mg/dL (ref 70–99)
Potassium: 3.7 mmol/L (ref 3.5–5.1)
Sodium: 138 mmol/L (ref 135–145)
Total Bilirubin: 0.9 mg/dL (ref 0.0–1.2)
Total Protein: 7.8 g/dL (ref 6.5–8.1)

## 2024-12-09 LAB — CBC
HCT: 40.8 % (ref 39.0–52.0)
Hemoglobin: 13.9 g/dL (ref 13.0–17.0)
MCH: 32 pg (ref 26.0–34.0)
MCHC: 34.1 g/dL (ref 30.0–36.0)
MCV: 94 fL (ref 80.0–100.0)
Platelets: 227 K/uL (ref 150–400)
RBC: 4.34 MIL/uL (ref 4.22–5.81)
RDW: 11.8 % (ref 11.5–15.5)
WBC: 9.1 K/uL (ref 4.0–10.5)
nRBC: 0 % (ref 0.0–0.2)

## 2024-12-09 LAB — LIPASE, BLOOD: Lipase: 21 U/L (ref 11–51)

## 2024-12-09 NOTE — ED Notes (Signed)
 Collected basics and urine specimen.

## 2024-12-09 NOTE — ED Triage Notes (Signed)
 To ER with report of upper abdominal pain that started after eating soup and a slice of pizza. No relief with tylenol /ibuprofen .   Gallbladder removed within the last 2 years.

## 2024-12-10 ENCOUNTER — Emergency Department

## 2024-12-10 ENCOUNTER — Inpatient Hospital Stay
Admission: EM | Admit: 2024-12-10 | Discharge: 2024-12-11 | DRG: 392 | Disposition: A | Attending: Nurse Practitioner | Admitting: Nurse Practitioner

## 2024-12-10 ENCOUNTER — Encounter: Payer: Self-pay | Admitting: Family Medicine

## 2024-12-10 ENCOUNTER — Inpatient Hospital Stay

## 2024-12-10 DIAGNOSIS — Z885 Allergy status to narcotic agent status: Secondary | ICD-10-CM | POA: Diagnosis not present

## 2024-12-10 DIAGNOSIS — Z833 Family history of diabetes mellitus: Secondary | ICD-10-CM | POA: Diagnosis not present

## 2024-12-10 DIAGNOSIS — F1010 Alcohol abuse, uncomplicated: Secondary | ICD-10-CM | POA: Diagnosis not present

## 2024-12-10 DIAGNOSIS — Z79899 Other long term (current) drug therapy: Secondary | ICD-10-CM | POA: Diagnosis not present

## 2024-12-10 DIAGNOSIS — E785 Hyperlipidemia, unspecified: Secondary | ICD-10-CM | POA: Diagnosis present

## 2024-12-10 DIAGNOSIS — R1013 Epigastric pain: Secondary | ICD-10-CM | POA: Diagnosis not present

## 2024-12-10 DIAGNOSIS — K2980 Duodenitis without bleeding: Secondary | ICD-10-CM | POA: Diagnosis present

## 2024-12-10 DIAGNOSIS — Z7901 Long term (current) use of anticoagulants: Secondary | ICD-10-CM | POA: Diagnosis not present

## 2024-12-10 DIAGNOSIS — R109 Unspecified abdominal pain: Secondary | ICD-10-CM | POA: Diagnosis present

## 2024-12-10 DIAGNOSIS — Z87891 Personal history of nicotine dependence: Secondary | ICD-10-CM | POA: Diagnosis not present

## 2024-12-10 DIAGNOSIS — I748 Embolism and thrombosis of other arteries: Secondary | ICD-10-CM | POA: Diagnosis present

## 2024-12-10 DIAGNOSIS — R1011 Right upper quadrant pain: Secondary | ICD-10-CM | POA: Diagnosis not present

## 2024-12-10 DIAGNOSIS — R7303 Prediabetes: Secondary | ICD-10-CM | POA: Diagnosis present

## 2024-12-10 DIAGNOSIS — Z9049 Acquired absence of other specified parts of digestive tract: Secondary | ICD-10-CM | POA: Diagnosis not present

## 2024-12-10 DIAGNOSIS — K760 Fatty (change of) liver, not elsewhere classified: Secondary | ICD-10-CM | POA: Diagnosis present

## 2024-12-10 DIAGNOSIS — F1090 Alcohol use, unspecified, uncomplicated: Secondary | ICD-10-CM | POA: Diagnosis present

## 2024-12-10 DIAGNOSIS — N200 Calculus of kidney: Secondary | ICD-10-CM | POA: Diagnosis present

## 2024-12-10 DIAGNOSIS — I708 Atherosclerosis of other arteries: Secondary | ICD-10-CM | POA: Diagnosis present

## 2024-12-10 DIAGNOSIS — I10 Essential (primary) hypertension: Secondary | ICD-10-CM | POA: Diagnosis present

## 2024-12-10 DIAGNOSIS — M109 Gout, unspecified: Secondary | ICD-10-CM | POA: Diagnosis present

## 2024-12-10 HISTORY — DX: Prediabetes: R73.03

## 2024-12-10 HISTORY — DX: Hyperlipidemia, unspecified: E78.5

## 2024-12-10 HISTORY — DX: Gout, unspecified: M10.9

## 2024-12-10 HISTORY — DX: Essential (primary) hypertension: I10

## 2024-12-10 LAB — LIPID PANEL
Cholesterol: 126 mg/dL (ref 0–200)
HDL: 47 mg/dL (ref >40)
LDL Cholesterol: 65 mg/dL (ref 0–99)
Total CHOL/HDL Ratio: 2.7 ratio
Triglycerides: 68 mg/dL (ref <150)
VLDL: 14 mg/dL (ref 0–40)

## 2024-12-10 LAB — LACTIC ACID, PLASMA: Lactic Acid, Venous: 1.5 mmol/L (ref 0.5–1.9)

## 2024-12-10 LAB — HEPARIN LEVEL (UNFRACTIONATED)
Heparin Unfractionated: 0.49 [IU]/mL (ref 0.30–0.70)
Heparin Unfractionated: 0.31 [IU]/mL (ref 0.30–0.70)

## 2024-12-10 LAB — APTT: aPTT: 27 s (ref 24–36)

## 2024-12-10 LAB — PROTIME-INR
INR: 1 (ref 0.8–1.2)
Prothrombin Time: 13.7 s (ref 11.4–15.2)

## 2024-12-10 LAB — HIV ANTIBODY (ROUTINE TESTING W REFLEX): HIV Screen 4th Generation wRfx: NONREACTIVE

## 2024-12-10 LAB — TROPONIN T, HIGH SENSITIVITY
Troponin T High Sensitivity: 20 ng/L — ABNORMAL HIGH (ref 0–19)
Troponin T High Sensitivity: <15 ng/L (ref 0–19)

## 2024-12-10 MED ORDER — PANTOPRAZOLE SODIUM 40 MG IV SOLR
40.0000 mg | Freq: Once | INTRAVENOUS | Status: AC
  Administered 2024-12-10: 40 mg via INTRAVENOUS
  Filled 2024-12-10: qty 10

## 2024-12-10 MED ORDER — ONDANSETRON HCL 4 MG/2ML IJ SOLN: 4.0000 mg | Freq: Four times a day (QID) | INTRAMUSCULAR | Status: DC | PRN

## 2024-12-10 MED ORDER — ONDANSETRON HCL 4 MG PO TABS: 4.0000 mg | ORAL_TABLET | Freq: Four times a day (QID) | ORAL | Status: DC | PRN

## 2024-12-10 MED ORDER — PANTOPRAZOLE SODIUM 40 MG IV SOLR
40.0000 mg | Freq: Two times a day (BID) | INTRAVENOUS | Status: DC
  Administered 2024-12-10 – 2024-12-11 (×3): 40 mg via INTRAVENOUS
  Filled 2024-12-10 (×3): qty 10

## 2024-12-10 MED ORDER — ACETAMINOPHEN 650 MG RE SUPP: 650.0000 mg | Freq: Four times a day (QID) | RECTAL | Status: DC | PRN

## 2024-12-10 MED ORDER — IOHEXOL 350 MG/ML SOLN
100.0000 mL | Freq: Once | INTRAVENOUS | Status: AC | PRN
  Administered 2024-12-10: 100 mL via INTRAVENOUS

## 2024-12-10 MED ORDER — FOLIC ACID 1 MG PO TABS
1.0000 mg | ORAL_TABLET | Freq: Every day | ORAL | Status: DC
  Administered 2024-12-10 – 2024-12-11 (×2): 1 mg via ORAL
  Filled 2024-12-10: qty 1

## 2024-12-10 MED ORDER — HYDROMORPHONE HCL 1 MG/ML IJ SOLN
0.5000 mg | INTRAMUSCULAR | Status: DC | PRN
  Administered 2024-12-10: 0.5 mg via INTRAVENOUS
  Filled 2024-12-10 (×2): qty 0.5

## 2024-12-10 MED ORDER — LORAZEPAM 1 MG PO TABS: 1.0000 mg | ORAL_TABLET | ORAL | Status: DC | PRN

## 2024-12-10 MED ORDER — ADULT MULTIVITAMIN W/MINERALS CH
1.0000 | ORAL_TABLET | Freq: Every day | ORAL | Status: DC
  Administered 2024-12-10 – 2024-12-11 (×2): 1 via ORAL
  Filled 2024-12-10 (×2): qty 1

## 2024-12-10 MED ORDER — THIAMINE HCL 100 MG/ML IJ SOLN: 100.0000 mg | Freq: Every day | INTRAMUSCULAR | Status: DC

## 2024-12-10 MED ORDER — ALUM & MAG HYDROXIDE-SIMETH 200-200-20 MG/5ML PO SUSP
30.0000 mL | Freq: Once | ORAL | Status: AC
  Administered 2024-12-10: 30 mL via ORAL
  Filled 2024-12-10: qty 30

## 2024-12-10 MED ORDER — ACETAMINOPHEN 325 MG PO TABS: 650.0000 mg | ORAL_TABLET | Freq: Four times a day (QID) | ORAL | Status: DC | PRN

## 2024-12-10 MED ORDER — SODIUM CHLORIDE 0.9% FLUSH
3.0000 mL | Freq: Two times a day (BID) | INTRAVENOUS | Status: DC
  Administered 2024-12-10 – 2024-12-11 (×3): 3 mL via INTRAVENOUS

## 2024-12-10 MED ORDER — HEPARIN BOLUS VIA INFUSION
6800.0000 [IU] | Freq: Once | INTRAVENOUS | Status: AC
  Administered 2024-12-10: 6800 [IU] via INTRAVENOUS
  Filled 2024-12-10: qty 6800

## 2024-12-10 MED ORDER — THIAMINE MONONITRATE 100 MG PO TABS
100.0000 mg | ORAL_TABLET | Freq: Every day | ORAL | Status: DC
  Administered 2024-12-11: 09:00:00 100 mg via ORAL
  Filled 2024-12-10: qty 1

## 2024-12-10 MED ORDER — LORAZEPAM 2 MG/ML IJ SOLN: 1.0000 mg | INTRAMUSCULAR | Status: DC | PRN

## 2024-12-10 MED ORDER — DEXTROMETHORPHAN POLISTIREX ER 30 MG/5ML PO SUER
30.0000 mg | Freq: Two times a day (BID) | ORAL | Status: DC
  Administered 2024-12-10 – 2024-12-11 (×3): 30 mg via ORAL
  Filled 2024-12-10 (×3): qty 5

## 2024-12-10 MED ORDER — HEPARIN (PORCINE) 25000 UT/250ML-% IV SOLN
1650.0000 [IU]/h | INTRAVENOUS | Status: DC
  Administered 2024-12-10 (×2): 1650 [IU]/h via INTRAVENOUS
  Filled 2024-12-10 (×3): qty 250

## 2024-12-10 MED ORDER — IOHEXOL 300 MG/ML  SOLN
100.0000 mL | Freq: Once | INTRAMUSCULAR | Status: AC | PRN
  Administered 2024-12-10: 100 mL via INTRAVENOUS

## 2024-12-10 NOTE — Progress Notes (Signed)
° °  Brief Progress Note   _____________________________________________________________________________________________________________  Patient Name: Daryl Jones Patient DOB: 11-07-1966 Date: @TODAY @      Data: Reviewed vital signs, labs, and clinical notes.    Action: No action required at this time.     Response:  Vascular surgery consult.  _____________________________________________________________________________________________________________  [facility] RN Expeditor Vera Furniss S Zahli Vetsch Please contact us  directly via secure chat (search for [facility]) or by calling us  at [phone] [facility]).

## 2024-12-10 NOTE — Consult Note (Addendum)
 Consultation  Referring Provider:  Hospitalist team    Admit date: 12/10/2024 Consult date: 12/10/2024         Reason for Consultation: Duodenitis, abdominal pain               HPI:   Daryl Jones is a 58 y.o. gentleman with history of cholecystectomy, alcohol abuse, hypertension and gout here with RUQ pain and findings of right hepatic artery stenosis/thrombosis along with duodenitis. Patient notes pain started abruptly on Friday when eating. He has never had this pain before. Imaging done in the ED shows severe right hepatic artery thrombosis/stenosis but distal flow is present. He also has duodenitis which I suspect is from the stenosis. Less likely to be ulcer as he takes no NSAIDS, aspirin, bc powders, or goodies powders. He does drink alcohol daily but no history of pancreatitis. No history of blood clots. He does not have evidence of cirrhosis. No recent abdominal surgeries. His liver enzymes are normal.   Past Medical History:  Diagnosis Date   Gout    HLD (hyperlipidemia)    Hypertension    Pre-diabetes     Past Surgical History:  Procedure Laterality Date   CHOLECYSTECTOMY  03/17/2021    Family History  Problem Relation Age of Onset   Diabetes Mother     Social History[1]  Prior to Admission medications  Medication Sig Start Date End Date Taking? Authorizing Provider  allopurinol (ZYLOPRIM) 300 MG tablet Take 300 mg by mouth daily. 07/20/24  Yes [provider]  amLODipine  (NORVASC ) 5 MG tablet Take 5 mg by mouth daily.   Yes [provider]  atorvastatin  (LIPITOR) 40 MG tablet Take 40 mg by mouth daily.   Yes [provider]  ibuprofen  (ADVIL ) 800 MG tablet Take 1 tablet (800 mg total) by mouth every 8 (eight) hours as needed. 03/18/21  Yes Schulz, Zachary R, PA-C  lisinopril -hydrochlorothiazide  (ZESTORETIC ) 20-25 MG tablet Take 1 tablet by mouth daily.   Yes [provider]    Current Facility-Administered Medications   Medication Dose Route Frequency Provider Last Rate Last Admin   acetaminophen  (TYLENOL ) tablet 650 mg  650 mg Oral Q6H PRN Alto Isaiah CROME, NP       Or   acetaminophen  (TYLENOL ) suppository 650 mg  650 mg Rectal Q6H PRN Alto Isaiah CROME, NP       dextromethorphan  (DELSYM ) 30 MG/5ML liquid 30 mg  30 mg Oral BID Alto Isaiah CROME, NP   30 mg at 12/10/24 1245   folic acid  (FOLVITE ) tablet 1 mg  1 mg Oral Daily Alto Isaiah CROME, NP       heparin  ADULT infusion 100 units/mL (25000 units/250mL)  1,650 Units/hr Intravenous Continuous Dail Rankin RAMAN, RPH 16.5 mL/hr at 12/10/24 1451 1,650 Units/hr at 12/10/24 1451   HYDROmorphone  (DILAUDID ) injection 0.5 mg  0.5 mg Intravenous Q2H PRN Alto Isaiah CROME, NP   0.5 mg at 12/10/24 0945   LORazepam  (ATIVAN ) tablet 1-4 mg  1-4 mg Oral Q1H PRN Alto Isaiah CROME, NP       Or   LORazepam  (ATIVAN ) injection 1-4 mg  1-4 mg Intravenous Q1H PRN Alto Isaiah CROME, NP       multivitamin with minerals tablet 1 tablet  1 tablet Oral Daily Alto Isaiah CROME, NP   1 tablet at 12/10/24 1142   ondansetron  (ZOFRAN ) tablet 4 mg  4 mg Oral Q6H PRN Alto Isaiah CROME, NP       Or   ondansetron  (ZOFRAN )  injection 4 mg  4 mg Intravenous Q6H PRN Alto Isaiah CROME, NP       pantoprazole  (PROTONIX ) injection 40 mg  40 mg Intravenous Q12H Alto Isaiah CROME, NP   40 mg at 12/10/24 0943   sodium chloride  flush (NS) 0.9 % injection 3 mL  3 mL Intravenous Q12H Alto Isaiah CROME, NP   3 mL at 12/10/24 0947   thiamine  (VITAMIN B1) tablet 100 mg  100 mg Oral Daily Alto Isaiah CROME, NP       Or   thiamine  (VITAMIN B1) injection 100 mg  100 mg Intravenous Daily Alto Isaiah CROME, NP       Current Outpatient Medications  Medication Sig Dispense Refill   allopurinol (ZYLOPRIM) 300 MG tablet Take 300 mg by mouth daily.     amLODipine  (NORVASC ) 5 MG tablet Take 5 mg by mouth daily.     atorvastatin  (LIPITOR) 40 MG tablet Take 40 mg by mouth daily.     ibuprofen  (ADVIL ) 800 MG tablet Take 1 tablet  (800 mg total) by mouth every 8 (eight) hours as needed. 30 tablet 0   lisinopril -hydrochlorothiazide  (ZESTORETIC ) 20-25 MG tablet Take 1 tablet by mouth daily.      Allergies as of 12/09/2024 - Review Complete 12/09/2024  Allergen Reaction Noted   Morphine Hives and Nausea And Vomiting 10/02/2014   Morphine and codeine Nausea And Vomiting 05/06/2020     Review of Systems:    All systems reviewed and negative except where noted in HPI.  Review of Systems  Constitutional:  Negative for chills and fever.  Respiratory:  Negative for shortness of breath.   Cardiovascular:  Negative for chest pain.  Gastrointestinal:  Positive for abdominal pain. Negative for nausea and vomiting.  Musculoskeletal:  Negative for joint pain.  Skin:  Negative for rash.  Neurological:  Negative for focal weakness.  Psychiatric/Behavioral:  Negative for substance abuse.   All other systems reviewed and are negative.      Physical Exam:  Vital signs in last 24 hours: Temp:  [98.1 F (36.7 C)-99.7 F (37.6 C)] 99.7 F (37.6 C) (12/14 1141) Pulse Rate:  [74-84] 76 (12/14 1430) Resp:  [16-22] 22 (12/14 1430) BP: (137-160)/(74-97) 147/93 (12/14 1430) SpO2:  [97 %-100 %] 97 % (12/14 1430) Weight:  [105.7 kg] 105.7 kg (12/13 2227)   General:   Pleasant in NAD Head:  Normocephalic and atraumatic. Eyes:   No icterus.   Conjunctiva pink. Ears:  Normal auditory acuity. Mouth: Mucosa pink moist, no lesions. Neck:  Supple; no masses felt Lungs:  No respiratory distress Abdomen:   Flat, soft, nondistended, nontender Msk:  No clubbing or cyanosis. Strength 5/5. Symmetrical without gross deformities. Neurologic:  Alert and  oriented x4; no focal deficits Skin:  Warm, dry, pink without significant lesions or rashes. Psych:  Alert and cooperative. Normal affect.  LAB RESULTS: Recent Labs    12/09/24 2242  WBC 9.1  HGB 13.9  HCT 40.8  PLT 227   BMET Recent Labs    12/09/24 2242  NA 138  K 3.7   CL 98  CO2 24  GLUCOSE 96  BUN 16  CREATININE 0.83  CALCIUM  9.3   LFT Recent Labs    12/09/24 2242  PROT 7.8  ALBUMIN 4.4  AST 29  ALT 21  ALKPHOS 61  BILITOT 0.9   PT/INR Recent Labs    12/10/24 0428  LABPROT 13.7  INR 1.0    STUDIES: CT ABDOMEN PELVIS W CONTRAST  Result Date: 12/10/2024 EXAM: CT ABDOMEN AND PELVIS WITH CONTRAST 12/10/2024 02:48:14 AM TECHNIQUE: CT of the abdomen and pelvis was performed with the administration of 100 mL of iohexol  (OMNIPAQUE ) 300 MG/ML solution. Multiplanar reformatted images are provided for review. Automated exposure control, iterative reconstruction, and/or weight-based adjustment of the mA/kV was utilized to reduce the radiation dose to as low as reasonably achievable. COMPARISON: CT with iv contrast 03/17/21. CLINICAL HISTORY: Upper abdominal pain, onset after his last meal. Acetaminophen  and ibuprofen  did not help. He had a cholecystectomy on 03/17/2021. No more recent surgeries are referenced in the medical record. FINDINGS: LOWER CHEST: No acute abnormality. LIVER: The liver is mildly steatotic. No mass is seen. There is edema in the porta hepatis. The hepatic portal vein and intrahepatic branches opacify well. The left hepatic artery and branches opacify well. There is severe stenosis in the right hepatic artery on series 2, axial image 21 and series 5 coronal reconstruction image 47. . The vessel appears to be at least partially thrombosed but it does reconstitute at the bifurcation with good flow distal to this. It is unclear what may be causing the edema/inflammation in this area. This could be related to the proximal descending segment of the duodenum such as duodenitis or could be related to extrahepatic cholangitis. This is above the level of the pancreas and is considered less likely to be a reflection of pancreatitis. GALLBLADDER AND BILE DUCTS: Surgical absence of the gallbladder. No biliary ductal dilatation. SPLEEN: No acute  abnormality. PANCREAS: There is no pancreatic mass or ductal dilatation. ADRENAL GLANDS: There is no adrenal mass. KIDNEYS, URETERS AND BLADDER: There is no renal mass. There is chronic perinephric stranding which is unchanged. There is a 1.5 x 0.7 x 0.8 cm stone again in the inferior pole collecting system of the left kidney. No other stones are seen. No hydronephrosis or ureteral stones. The urinary bladder is unremarkable. GI AND BOWEL: The gastric wall is unremarkable. There are mildly thickened folds in the descending duodenum with the remainder of the small bowel unremarkable. The appendix is normal. There are descending and sigmoid diverticula without evidence of diverticulitis. PERITONEUM AND RETROPERITONEUM: No ascites. No free air. No free hemorrhage or localizing collections. VASCULATURE: Aorta is normal in caliber. Minimal aortic atherosclerosis is seen without aneurysm. Apart from the right hepatic main artery, The other celiac branches opacify well with no branch occlusions including the right hepatic artery branches. No other significant vascular findings. LYMPH NODES: No lymphadenopathy. There is a calcified lymph node within the right inguinal canal. REPRODUCTIVE ORGANS: No acute abnormality. BONES AND SOFT TISSUES: There are small inguinal fat hernias. There are mild degenerative changes of the spine and hip joints, asymmetric sclerosis and spurring of the left pubic bone. IMPRESSION: 1. 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 2. Porta hepatis edema, favored inflammatory, with differential including duodenitis versus extrahepatic cholangitis; no pancreatic mass or ductal dilatation. These inflammatory changes are above the pancreatic level, considered less likely to be due to pancreatitis. 3. Mildly thickened folds in the descending duodenum, which may reflect duodenitis. 4. Nonobstructing  1.5 x 0.7 x 0.8 cm left inferior pole renal calyceal stone without hydronephrosis or ureteral calculi. Electronically signed by: Francis Quam MD 12/10/2024 03:26 AM EST RP Workstation: [id number]       Impression / Plan:   58 y.o. gentleman with history of cholecystectomy, alcohol abuse, hypertension and  gout here with RUQ pain and findings of right hepatic artery stenosis/thrombosis along with duodenitis. I suspect the duodenitis is due the thrombosis given the small bowel is fed by that branch of the hepatic artery but unclear why he would have such a thrombosis without any significant risk factors such as recent surgery  - agree with vascular surgery consult, appreciate recs - agree with heparin  drip, no evidence of cirrhosis on imaging or labs - can use PPI IV or PO daily - agree with CTA - no indication for endoscopic evaluation at this time - may need hematology eval for hypercoagulable work up  Will continue to follow, please call with any questions or concerns. Dr. Jinny will be covering tomorrow.  Frederic Schick MD, MPH [facility]      [1]  Social History Tobacco Use   Smoking status: Former    Types: Cigarettes   Smokeless tobacco: Never  Vaping Use   Vaping status: Never Used  Substance Use Topics   Alcohol use: Yes    Alcohol/week: 14.0 - 21.0 standard drinks of alcohol    Types: 14 - 21 Cans of beer per week    Comment: 2-3 beers per day

## 2024-12-10 NOTE — Progress Notes (Signed)
 ANTICOAGULATION CONSULT NOTE  Pharmacy Consult for heparin  infusion Indication: hepatic artery clot  Allergies[1]  Patient Measurements: Height: 5' 11 (180.3 cm) Weight: 105.7 kg (233 lb) IBW/kg (Calculated) : 75.3 HEPARIN  DW (KG): 97.6  Vital Signs: Temp: 98.4 F (36.9 C) (12/14 0300) Temp Source: Oral (12/14 0300) BP: 158/96 (12/14 0300) Pulse Rate: 79 (12/14 0300)  Labs: Recent Labs    12/09/24 2242  HGB 13.9  HCT 40.8  PLT 227  CREATININE 0.83    Estimated Creatinine Clearance: 117.1 mL/min (by C-G formula based on SCr of 0.83 mg/dL).   Medical History: History reviewed. No pertinent past medical history.  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  Goal of Therapy:  Heparin  level 0.3-0.7 units/ml Monitor platelets by anticoagulation protocol: Yes   Plan:  Bolus 6800 units x 1 Start heparin  infusion at 1650 units/hr Will check HL in 6 hr after start of infusion CBC daily while on heparin   Rankin CANDIE Dills, PharmD, [facility] 12/10/2024 4:16 AM      [1]  Allergies Allergen Reactions   Morphine Hives and Nausea And Vomiting   Morphine And Codeine Nausea And Vomiting

## 2024-12-10 NOTE — Consult Note (Signed)
 [facility] Vascular Consult Note  MRN : [id number]  Daryl Jones is a 58 y.o. (1965-12-31) male who presents with chief complaint of  Chief Complaint  Patient presents with   Abdominal Pain    Upper  .  History of Present Illness: 58yom PMH- HLD, significant ETOH abuse, prior cholestectomy presented with acute epigastric and right upper quadrant pain after eating pizza last night. Denied nausea, vomiting or diarrhea. Denies similar symptoms in the past. Denies weight loss. 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 Upon exam this afternoon patient states the pain has resolved.  Current Facility-Administered Medications  Medication Dose Route Frequency Provider Last Rate Last Admin   acetaminophen  (TYLENOL ) tablet 650 mg  650 mg Oral Q6H PRN Alto Isaiah CROME, NP       Or   acetaminophen  (TYLENOL ) suppository 650 mg  650 mg Rectal Q6H PRN Alto Isaiah CROME, NP       dextromethorphan  (DELSYM ) 30 MG/5ML liquid 30 mg  30 mg Oral BID Alto Isaiah CROME, NP   30 mg at 12/10/24 1245   folic acid  (FOLVITE ) tablet 1 mg  1 mg Oral Daily Alto Isaiah CROME, NP       heparin  ADULT infusion 100 units/mL (25000 units/250mL)  1,650 Units/hr Intravenous Continuous Dail Rankin RAMAN, RPH 16.5 mL/hr at 12/10/24 1451 1,650 Units/hr at 12/10/24 1451   HYDROmorphone  (DILAUDID ) injection 0.5 mg  0.5 mg Intravenous Q2H PRN Alto Isaiah CROME, NP   0.5 mg at 12/10/24 0945   LORazepam  (ATIVAN ) tablet 1-4 mg  1-4 mg Oral Q1H PRN Alto Isaiah CROME, NP       Or   LORazepam  (ATIVAN ) injection 1-4 mg  1-4 mg Intravenous Q1H PRN Alto Isaiah CROME, NP       multivitamin with minerals tablet 1 tablet  1 tablet Oral Daily Alto Isaiah CROME, NP   1 tablet at 12/10/24 1142   ondansetron  (ZOFRAN ) tablet 4 mg  4 mg Oral Q6H PRN Alto Isaiah CROME, NP       Or   ondansetron  (ZOFRAN ) injection 4 mg  4 mg Intravenous Q6H PRN Alto Isaiah CROME, NP       pantoprazole   (PROTONIX ) injection 40 mg  40 mg Intravenous Q12H Alto Isaiah CROME, NP   40 mg at 12/10/24 0943   sodium chloride  flush (NS) 0.9 % injection 3 mL  3 mL Intravenous Q12H Alto Isaiah CROME, NP   3 mL at 12/10/24 0947   thiamine  (VITAMIN B1) tablet 100 mg  100 mg Oral Daily Alto Isaiah CROME, NP       Or   thiamine  (VITAMIN B1) injection 100 mg  100 mg Intravenous Daily Alto Isaiah CROME, NP       Current Outpatient Medications  Medication Sig Dispense Refill   allopurinol (ZYLOPRIM) 300 MG tablet Take 300 mg by mouth daily.     amLODipine  (NORVASC ) 5 MG tablet Take 5 mg by mouth daily.     atorvastatin  (LIPITOR) 40 MG tablet Take 40 mg by mouth daily.     ibuprofen  (ADVIL ) 800 MG tablet Take 1 tablet (800 mg total) by mouth every 8 (eight) hours as needed. 30 tablet 0   lisinopril -hydrochlorothiazide  (ZESTORETIC ) 20-25 MG tablet Take 1 tablet by mouth daily.      Past Medical History:  Diagnosis Date   Gout    HLD (hyperlipidemia)    Hypertension    Pre-diabetes     Past  Surgical History:  Procedure Laterality Date   CHOLECYSTECTOMY  03/17/2021    Social History Social History[1]  Family History Family History  Problem Relation Age of Onset   Diabetes Mother     Allergies[2]   REVIEW OF SYSTEMS (Negative unless checked)  Constitutional: [] Weight loss  [] Fever  [] Chills Cardiac: [] Chest pain   [] Chest pressure   [] Palpitations   [] Shortness of breath when laying flat   [] Shortness of breath at rest   [] Shortness of breath with exertion. Vascular:  [] Pain in legs with walking   [] Pain in legs at rest   [] Pain in legs when laying flat   [] Claudication   [] Pain in feet when walking  [] Pain in feet at rest  [] Pain in feet when laying flat   [] History of DVT   [] Phlebitis   [] Swelling in legs   [] Varicose veins   [] Non-healing ulcers Pulmonary:   [] Uses home oxygen   [] Productive cough   [] Hemoptysis   [] Wheeze  [] COPD   [] Asthma Neurologic:  [] Dizziness  [] Blackouts   [] Seizures    [] History of stroke   [] History of TIA  [] Aphasia   [] Temporary blindness   [] Dysphagia   [] Weakness or numbness in arms   [] Weakness or numbness in legs Musculoskeletal:  [] Arthritis   [] Joint swelling   [] Joint pain   [] Low back pain Hematologic:  [] Easy bruising  [] Easy bleeding   [] Hypercoagulable state   [] Anemic  [] Hepatitis Gastrointestinal:  [] Blood in stool   [] Vomiting blood  [] Gastroesophageal reflux/heartburn   [] Difficulty swallowing. Genitourinary:  [] Chronic kidney disease   [] Difficult urination  [] Frequent urination  [] Burning with urination   [] Blood in urine Skin:  [] Rashes   [] Ulcers   [] Wounds Psychological:  [] History of anxiety   []  History of major depression.  Physical Examination  Vitals:   12/10/24 0830 12/10/24 1130 12/10/24 1141 12/10/24 1430  BP: 137/74 (!) 141/93  (!) 147/93  Pulse: 83 78  76  Resp: 18 19  (!) 22  Temp:   99.7 F (37.6 C)   TempSrc:   Oral   SpO2: 97% 100%  97%  Weight:      Height:       Body mass index is 32.5 kg/m. Gen:  WD/WN, NAD Head: [place]/AT, No temporalis wasting. Prominent temp pulse not noted. Neck: Trachea midline.  No JVD.  Pulmonary:  Good air movement, respirations not labored, equal bilaterally.  Cardiac: RRR, normal S1, S2. Vascular:  Vessel Right Left  Radial Palpable Palpable  Ulnar    Brachial    Carotid Palpable, without bruit Palpable, without bruit  Aorta Not palpable N/A  Femoral Palpable Palpable  Popliteal    PT    DP Palpable Palpable   Gastrointestinal: soft, non-tender/non-distended. No guarding/reflex.  Musculoskeletal: M/S 5/5 throughout.  Extremities without ischemic changes.  No deformity or atrophy. No edema. Neurologic: Sensation grossly intact in extremities.  Symmetrical.  Speech is fluent. Motor exam as listed above. Psychiatric: Judgment intact, Mood & affect appropriate for pt's clinical situation. Dermatologic: No rashes or ulcers noted.  No cellulitis or open  wounds.      CBC Lab Results  Component Value Date   WBC 9.1 12/09/2024   HGB 13.9 12/09/2024   HCT 40.8 12/09/2024   MCV 94.0 12/09/2024   PLT 227 12/09/2024    BMET    Component Value Date/Time   NA 138 12/09/2024 2242   K 3.7 12/09/2024 2242   CL 98 12/09/2024 2242   CO2 24 12/09/2024  2242   GLUCOSE 96 12/09/2024 2242   BUN 16 12/09/2024 2242   CREATININE 0.83 12/09/2024 2242   CALCIUM  9.3 12/09/2024 2242   GFRNONAA >60 12/09/2024 2242   Estimated Creatinine Clearance: 117.1 mL/min (by C-G formula based on SCr of 0.83 mg/dL).  COAG Lab Results  Component Value Date   INR 1.0 12/10/2024    Radiology CT ABDOMEN PELVIS W CONTRAST Result Date: 12/10/2024 EXAM: CT ABDOMEN AND PELVIS WITH CONTRAST 12/10/2024 02:48:14 AM TECHNIQUE: CT of the abdomen and pelvis was performed with the administration of 100 mL of iohexol  (OMNIPAQUE ) 300 MG/ML solution. Multiplanar reformatted images are provided for review. Automated exposure control, iterative reconstruction, and/or weight-based adjustment of the mA/kV was utilized to reduce the radiation dose to as low as reasonably achievable. COMPARISON: CT with iv contrast 03/17/21. CLINICAL HISTORY: Upper abdominal pain, onset after his last meal. Acetaminophen  and ibuprofen  did not help. He had a cholecystectomy on 03/17/2021. No more recent surgeries are referenced in the medical record. FINDINGS: LOWER CHEST: No acute abnormality. LIVER: The liver is mildly steatotic. No mass is seen. There is edema in the porta hepatis. The hepatic portal vein and intrahepatic branches opacify well. The left hepatic artery and branches opacify well. There is severe stenosis in the right hepatic artery on series 2, axial image 21 and series 5 coronal reconstruction image 47. . The vessel appears to be at least partially thrombosed but it does reconstitute at the bifurcation with good flow distal to this. It is unclear what may be causing the  edema/inflammation in this area. This could be related to the proximal descending segment of the duodenum such as duodenitis or could be related to extrahepatic cholangitis. This is above the level of the pancreas and is considered less likely to be a reflection of pancreatitis. GALLBLADDER AND BILE DUCTS: Surgical absence of the gallbladder. No biliary ductal dilatation. SPLEEN: No acute abnormality. PANCREAS: There is no pancreatic mass or ductal dilatation. ADRENAL GLANDS: There is no adrenal mass. KIDNEYS, URETERS AND BLADDER: There is no renal mass. There is chronic perinephric stranding which is unchanged. There is a 1.5 x 0.7 x 0.8 cm stone again in the inferior pole collecting system of the left kidney. No other stones are seen. No hydronephrosis or ureteral stones. The urinary bladder is unremarkable. GI AND BOWEL: The gastric wall is unremarkable. There are mildly thickened folds in the descending duodenum with the remainder of the small bowel unremarkable. The appendix is normal. There are descending and sigmoid diverticula without evidence of diverticulitis. PERITONEUM AND RETROPERITONEUM: No ascites. No free air. No free hemorrhage or localizing collections. VASCULATURE: Aorta is normal in caliber. Minimal aortic atherosclerosis is seen without aneurysm. Apart from the right hepatic main artery, The other celiac branches opacify well with no branch occlusions including the right hepatic artery branches. No other significant vascular findings. LYMPH NODES: No lymphadenopathy. There is a calcified lymph node within the right inguinal canal. REPRODUCTIVE ORGANS: No acute abnormality. BONES AND SOFT TISSUES: There are small inguinal fat hernias. There are mild degenerative changes of the spine and hip joints, asymmetric sclerosis and spurring of the left pubic bone. IMPRESSION: 1. 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 2. Porta hepatis edema, favored inflammatory, with differential including duodenitis versus extrahepatic cholangitis; no pancreatic mass or ductal dilatation. These inflammatory changes are above the pancreatic level, considered less likely to be  due to pancreatitis. 3. Mildly thickened folds in the descending duodenum, which may reflect duodenitis. 4. Nonobstructing 1.5 x 0.7 x 0.8 cm left inferior pole renal calyceal stone without hydronephrosis or ureteral calculi. Electronically signed by: Francis Quam MD 12/10/2024 03:26 AM EST RP Workstation: [id number]      Assessment/Plan Duodenitis Right Hepatic artery stenosis  Heparin  gtt Obtain CTA to fully evaluate Hepatic artery and mesenteric vasculature GI evaluation  Further recommendations once study complete   Tisa Curry LABOR, MD  12/10/2024 4:10 PM    This note was created with Dragon medical transcription system.  Any error is purely unintentional      [1]  Social History Tobacco Use   Smoking status: Former    Types: Cigarettes   Smokeless tobacco: Never  Vaping Use   Vaping status: Never Used  Substance Use Topics   Alcohol use: Yes    Alcohol/week: 14.0 - 21.0 standard drinks of alcohol    Types: 14 - 21 Cans of beer per week    Comment: 2-3 beers per day  [2]  Allergies Allergen Reactions   Morphine Hives and Nausea And Vomiting   Morphine And Codeine Nausea And Vomiting

## 2024-12-10 NOTE — Progress Notes (Signed)
 ANTICOAGULATION CONSULT NOTE  Pharmacy Consult for heparin  infusion Indication: hepatic artery clot  Allergies[1]  Patient Measurements: Height: 5' 11 (180.3 cm) Weight: 105.7 kg (233 lb) IBW/kg (Calculated) : 75.3 HEPARIN  DW (KG): 97.6  Labs: Recent Labs    12/09/24 2242 12/10/24 0428 12/10/24 1134 12/10/24 1824  HGB 13.9  --   --   --   HCT 40.8  --   --   --   PLT 227  --   --   --   APTT  --  27  --   --   LABPROT  --  13.7  --   --   INR  --  1.0  --   --   HEPARINUNFRC  --   --  0.49 0.31  CREATININE 0.83  --   --   --     Estimated Creatinine Clearance: 117.1 mL/min (by C-G formula based on SCr of 0.83 mg/dL).   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  1214 1134 HL 0.49, thera x 1; 1650 un/hr 1214 1824 HL 0.31, therapeutic x 2  Goal of Therapy:  Heparin  level 0.3-0.7 units/ml Monitor platelets by anticoagulation protocol: Yes   Plan:  --Heparin  level is therapeutic x 2 --Continue heparin  infusion at 1650 units/hr --Re-check HL with AM labs --Daily CBC per protocol while on IV heparin   Willetta York Rodriguez-Guzman PharmD, BCPS 12/10/2024 7:08 PM      [1]  Allergies Allergen Reactions   Morphine Hives and Nausea And Vomiting   Morphine And Codeine Nausea And Vomiting

## 2024-12-10 NOTE — TOC Initial Note (Signed)
 Transition of Care [facility]) - Initial/Assessment Note    Patient Details  Name: Daryl Jones MRN: [id number] Date of Birth: 1966/05/03  [facility] of [facility] CM/SW Contact:    Chrisoula Zegarra L Joniah Bednarski, LCSW Phone Number: 12/10/2024, 10:35 AM  Clinical Narrative:                  [facility] consult received for substance abuse education/counseling. TOC does not provide education/counseling. Resources have been added to the AVS.        Patient Goals and CMS Choice            Expected Discharge Plan and Services                                              Prior Living Arrangements/Services                       Activities of Daily Living      Permission Sought/Granted                  Emotional Assessment              Admission diagnosis:  Abdominal pain [R10.9] Patient Active Problem List   Diagnosis Date Noted   Abdominal pain 12/10/2024   Status post laparoscopic cholecystectomy 04/02/2021   PCP:  [facility] Pharmacy:   [id number] [place], [place] - [address] AT [facility] [address] [facility] [place] [address] Phone: [phone] Fax: [phone]     Social Drivers of Health (SDOH) Social History: SDOH Screenings   Housing: Unknown (02/21/2024)   Received from [facility]  Tobacco Use: Medium Risk (12/10/2024)   SDOH Interventions:     Readmission Risk Interventions     No data to display

## 2024-12-10 NOTE — Progress Notes (Signed)
 ANTICOAGULATION CONSULT NOTE  Pharmacy Consult for heparin  infusion Indication: hepatic artery clot  Allergies[1]  Patient Measurements: Height: 5' 11 (180.3 cm) Weight: 105.7 kg (233 lb) IBW/kg (Calculated) : 75.3 HEPARIN  DW (KG): 97.6  Labs: Recent Labs    12/09/24 2242 12/10/24 0428 12/10/24 1134  HGB 13.9  --   --   HCT 40.8  --   --   PLT 227  --   --   APTT  --  27  --   LABPROT  --  13.7  --   INR  --  1.0  --   HEPARINUNFRC  --   --  0.49  CREATININE 0.83  --   --     Estimated Creatinine Clearance: 117.1 mL/min (by C-G formula based on SCr of 0.83 mg/dL).   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  1214 1134 HL 0.49, thera x 1; 1650 un/hr  Goal of Therapy:  Heparin  level 0.3-0.7 units/ml Monitor platelets by anticoagulation protocol: Yes   Plan:  --Heparin  level is therapeutic x 1 --Continue heparin  infusion at 1650 units/hr --Re-check confirmatory HL in 6 hours --Daily CBC per protocol while on IV heparin   Keeghan Mcintire B Adolfo Granieri 12/10/2024 12:18 PM    [1]  Allergies Allergen Reactions   Morphine Hives and Nausea And Vomiting   Morphine And Codeine Nausea And Vomiting

## 2024-12-10 NOTE — Discharge Instructions (Signed)

## 2024-12-10 NOTE — ED Provider Notes (Signed)
 [facility] Provider Note    Event Date/Time   First MD Initiated Contact with Patient 12/10/24 0105     (approximate)   History   Abdominal Pain (Upper)   HPI  Daryl Jones is a 58 y.o. male with prior gallbladder removal who comes in with upper abdominal pain that started after eating soup and a slice of pizza.  Patient reports constant pain since Friday.  Does report drinking alcohol about 2 beers daily but denies ever having any type this before.  He denies any lower abdominal pain, chest pain, shortness of breath.  Denies any blood in stools, falls and hitting his head or any other concerns  Physical Exam   Triage Vital Signs: ED Triage Vitals  Encounter Vitals Group     BP 12/09/24 2227 (!) 157/94     Girls Systolic BP Percentile --      Girls Diastolic BP Percentile --      Boys Systolic BP Percentile --      Boys Diastolic BP Percentile --      Pulse Rate 12/09/24 2227 84     Resp 12/09/24 2227 16     Temp 12/09/24 2227 98.4 F (36.9 C)     Temp Source 12/09/24 2227 Oral     SpO2 12/09/24 2227 98 %     Weight 12/09/24 2227 233 lb (105.7 kg)     Height 12/09/24 2227 5' 11 (1.803 m)     Head Circumference --      Peak Flow --      Pain Score 12/09/24 2243 9     Pain Loc --      Pain Education --      Exclude from Growth Chart --     Most recent vital signs: Vitals:   12/09/24 2227  BP: (!) 157/94  Pulse: 84  Resp: 16  Temp: 98.4 F (36.9 C)  SpO2: 98%     General: Awake, no distress.  CV:  Good peripheral perfusion.  Resp:  Normal effort.  Abd:  No distention.  Tender in the upper abdomen without any rebound, guarding Other:     ED Results / Procedures / Treatments   Labs (all labs ordered are listed, but only abnormal results are displayed) Labs Reviewed  URINALYSIS, ROUTINE W REFLEX MICROSCOPIC - Abnormal; Notable for the following components:      Result Value   Color, Urine YELLOW (*)    APPearance CLEAR (*)     Specific Gravity, Urine 1.036 (*)    Protein, ur 30 (*)    All other components within normal limits  LIPASE, BLOOD  COMPREHENSIVE METABOLIC PANEL WITH GFR  CBC     EKG  My interpretation of EKG:  Normal sinus rate of 78 without any ST elevation T wave inversion in lead III, normal intervals  RADIOLOGY I have reviewed the ct personally and interpreted patient with some inflammation of the duodenum   PROCEDURES:  Critical Care performed: yes  .Critical Care  Performed by: Ernest Ronal BRAVO, MD Authorized by: Ernest Ronal BRAVO, MD   Critical care provider statement:    Critical care time (minutes):  30   Critical care was necessary to treat or prevent imminent or life-threatening deterioration of the following conditions: thrombosis.   Critical care was time spent personally by me on the following activities:  Development of treatment plan with patient or surrogate, discussions with consultants, evaluation of patient's response to treatment, examination of patient,  ordering and review of laboratory studies, ordering and review of radiographic studies, ordering and performing treatments and interventions, pulse oximetry, re-evaluation of patient's condition and review of old charts    MEDICATIONS ORDERED IN ED: Medications  heparin  ADULT infusion 100 units/mL (25000 units/250mL) (1,650 Units/hr Intravenous New Bag/Given 12/10/24 0441)  pantoprazole  (PROTONIX ) injection 40 mg (40 mg Intravenous Given 12/10/24 0127)  alum & mag hydroxide-simeth (MAALOX/MYLANTA) 200-200-20 MG/5ML suspension 30 mL (30 mLs Oral Given 12/10/24 0127)  iohexol  (OMNIPAQUE ) 300 MG/ML solution 100 mL (100 mLs Intravenous Contrast Given 12/10/24 0238)  heparin  bolus via infusion 6,800 Units (6,800 Units Intravenous Bolus from Bag 12/10/24 0441)     IMPRESSION / MDM / ASSESSMENT AND PLAN / ED COURSE  I reviewed the triage vital signs and the nursing notes.   Patient's presentation is most consistent with  acute presentation with potential threat to life or bodily function.  Patient comes in with upper abdominal pain patient already has had gallbladder removed.  CT imaging ordered evaluate for obstruction, perforation or other acute pathology.  UA negative for UTI.  Lipase is normal CMP is normal CBC is normal  Troponin slightly elevated but upon repeat was downtrending so seems less likely cardiac  CT imaging concerning for duodenitis and some inflammation of the liver.  He denies any excessive Tylenol  use, drug use or any other concerns.  Does report some alcohol use.  There is concern for hepatic vein partial thrombosis.  Discussed the case with Dr. Tisa and will start patient on heparin  and they will evaluate patient tomorrow.  Will discuss the hospital team for admission.     FINAL CLINICAL IMPRESSION(S) / ED DIAGNOSES   Final diagnoses:  Duodenitis  Hepatic artery thrombosis (HCC)     Rx / DC Orders   ED Discharge Orders     None        Note:  This document was prepared using Dragon voice recognition software and may include unintentional dictation errors.   Ernest Ronal BRAVO, MD 12/10/24 [phone]

## 2024-12-10 NOTE — H&P (Signed)
 History and Physical    Patient: Daryl Jones FMW:[id number] DOB: March 12, 1966 DOA: 12/10/2024 DOS: the patient was seen and examined on 12/10/2024 PCP: [facility]  Patient coming from: Home  Chief Complaint:  Chief Complaint  Patient presents with   Abdominal Pain    Upper   HPI: CONLIN BRAHM is a 58 y.o. male with medical history significant of prior cholecystectomy, daily alcohol use, remote history of tobacco use, hypertension, dyslipidemia and gout.  Patient presented to the ED secondary to abrupt onset of abdominal pain that occurred after eating soup and pizza.  Patient states he also had consumed fruit with hot sauce.  No prior issues with similar abdominal pain.  Abdominal pain persists in the epigastric radiating to the right upper quadrant regions.  No fevers or chills.  No emesis.  No dark or bloody stools and no vomiting or diarrhea.  In the ED he was afebrile and hypertensive without hypoxemia.  Labs unremarkable.  Initial high-sensitivity troponin was 20 but follow-up was normal.  Lactic acid was normal at 1.5.  Urinalysis unremarkable.  EKG with normal sinus rhythm.  Because of the abdominal pain EDP obtained a CT of the abdomen and pelvis.  This revealed severe stenosis of the right hepatic artery with an appearance of partially thrombosed area that reconstitutes with good flow distally.  EDP contacted vascular on-call doctor and [facility] who will be seeing the patient today.  Recommended initiation of heparin  infusion.  There was also edema in the porta hepatis of unclear etiology although duodenitis or extrahepatic cholangitis could not be excluded.  Of note patient's LFTs were within normal limits.  There were no signs of pancreatitis on CT. duodenitis diagnosis supported by mildly thickened folds in the descending duodenum.  There was a stable nonobstructing calyceal stone in the left inferior pole without hydronephrosis or ureteral calculi.   Hospitalist service has been asked to evaluate the patient for admission.  Upon my interview with the patient and his family above history confirmed.  On exam he continues to have reproducible epigastric and right upper quadrant abdominal pain.  He denies changes in home medications or recent additions of new medications or OTC supplements/vitamins.   Review of Systems: As mentioned in the history of present illness. All other systems reviewed and are negative. Past Medical History:  Diagnosis Date   Gout    HLD (hyperlipidemia)    Hypertension    Pre-diabetes    Past Surgical History:  Procedure Laterality Date   CHOLECYSTECTOMY  03/17/2021   Social History:  reports that he has quit smoking. His smoking use included cigarettes. He has never used smokeless tobacco. He reports current alcohol use of about 14.0 - 21.0 standard drinks of alcohol per week. No history on file for drug use.  Allergies[1]  Family History  Problem Relation Age of Onset   Diabetes Mother     Prior to Admission medications  Medication Sig Start Date End Date Taking? Authorizing Provider  allopurinol (ZYLOPRIM) 300 MG tablet Take 300 mg by mouth daily. 07/20/24  Yes [provider]  amLODipine  (NORVASC ) 5 MG tablet Take 5 mg by mouth daily.   Yes [provider]  atorvastatin  (LIPITOR) 40 MG tablet Take 40 mg by mouth daily.   Yes [provider]  ibuprofen  (ADVIL ) 800 MG tablet Take 1 tablet (800 mg total) by mouth every 8 (eight) hours as needed. 03/18/21  Yes Schulz, Zachary R, PA-C  lisinopril -hydrochlorothiazide  (ZESTORETIC ) 20-25 MG  tablet Take 1 tablet by mouth daily.   Yes [provider]    Physical Exam: Vitals:   12/10/24 0300 12/10/24 0500 12/10/24 0753 12/10/24 0830  BP: (!) 158/96 (!) 157/93  137/74  Pulse: 79 77  83  Resp: 18 19  18   Temp: 98.4 F (36.9 C) 98.1 F (36.7 C)    TempSrc: Oral Oral    SpO2: 100% 98% 99% 97%  Weight:      Height:        Constitutional: NAD, calm, comfortable Respiratory: clear to auscultation bilaterally, no wheezing, no crackles. Normal respiratory effort. No accessory muscle use.  Cardiovascular: Regular rate and rhythm, no murmurs / rubs / gallops. No extremity edema. 2+ pedal pulses.  Abdomen: Focal tenderness over the epigastrium and right upper quadrant regions, no masses palpated. No obvious hepatosplenomegaly.  Hyperactive bowel sounds positive.  Musculoskeletal: no clubbing / cyanosis. No joint deformity upper and lower extremities. Good ROM, no contractures. Normal muscle tone.  Skin: no rashes, lesions, ulcers. No induration Neurologic: CN 2-12 grossly intact. Sensation intact, Strength 5/5 x all 4 extremities.  Psychiatric: Normal judgment and insight. Alert and oriented x 3. Normal mood.     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  Prior cholecystectomy but no elevation in LFTs including normal total bilirubin so choledocholithiasis unlikely Given abnormal appearance of thickened duodenal folds with porta hepatis edema and onset of abdominal pain after eating duodenitis is likely source of acute abdominal pain Check fecal occult blood and H pylori stool; PPI IV every 12 hours. Also found to have severe stenosis of right hepatic artery with thrombus but also has reconstitution with good distal flow.  Again LFTs are normal so I do not suspect at this juncture this is  contributing to abdominal pain and this may be an incidental finding Await evaluation of vascular team.  Will continue anticoagulation.  Uncertain if patient is candidate for stent procedure or if this is necessary.  Anticipate that he may need long-term anticoagulation after discharge. Risk factors for vascular disease include age, male sex, dyslipidemia, hypertension and remote tobacco use. Given acute duodenitis and requirement for acute anticoagulation cannot use NSAIDs for pain management.  Patient reports rash with morphine so we will use low-dose Dilaudid  as needed N.p.o. with IV fluids  Hypertension Current blood pressure readings in the hypertensive range but patient also experiencing ongoing abdominal pain Home medications Norvasc  along with lisinopril  hydrochlorothiazide -Will continue these medications but will hold HCTZ due to requirement for IV fluids while n.p.o.  Dyslipidemia Hold preadmission Lipitor while n.p.o. Lipid panel 1 year ago revealed total cholesterol 219 and HDL cholesterol 75 with LDL 129 Repeat lipid panel this admission  Regular alcohol use daily Admits to 2-3 beers per day As a precaution we will follow CIWA and have as needed Ativan  available per protocol  History of gout Hold Zyloprim acutely  Prediabetes 1 year ago hemoglobin A1c was 5.6 Repeat this admission    Advance Care Planning:   Code Status:  Full Code   VTE prophylaxis: IV heparin   Consults: Vascular  Family Communication: Family at bedside  Severity of Illness: The appropriate patient status for this patient is INPATIENT. Inpatient status is judged to be reasonable and necessary in order to provide the required intensity of service to ensure the patient's safety. The patient's presenting symptoms, physical exam findings, and initial radiographic and laboratory data in the context of their chronic comorbidities is felt to place them at high risk for further clinical deterioration.  Furthermore, it is not anticipated that the patient will be medically stable for discharge from the hospital within 2 midnights of admission.   * I certify that at the point of admission it is my clinical judgment that the patient will require inpatient hospital care spanning beyond 2 midnights from the point of admission due to high intensity of service, high risk for further deterioration and high frequency of surveillance required.*  Author: Isaiah Lever, NP 12/10/2024 10:06 AM  For on call review [email or web address].      [1]  Allergies Allergen Reactions   Morphine Hives and Nausea And Vomiting   Morphine And Codeine Nausea And Vomiting

## 2024-12-11 ENCOUNTER — Other Ambulatory Visit: Payer: Self-pay

## 2024-12-11 DIAGNOSIS — R1013 Epigastric pain: Secondary | ICD-10-CM

## 2024-12-11 LAB — COMPREHENSIVE METABOLIC PANEL WITH GFR
ALT: 20 U/L (ref 0–44)
AST: 20 U/L (ref 15–41)
Albumin: 3.7 g/dL (ref 3.5–5.0)
Alkaline Phosphatase: 53 U/L (ref 38–126)
Anion gap: 12 (ref 5–15)
BUN: 13 mg/dL (ref 6–20)
CO2: 25 mmol/L (ref 22–32)
Calcium: 8.8 mg/dL — ABNORMAL LOW (ref 8.9–10.3)
Chloride: 99 mmol/L (ref 98–111)
Creatinine, Ser: 0.85 mg/dL (ref 0.61–1.24)
GFR, Estimated: >60 mL/min (ref >60)
Glucose, Bld: 97 mg/dL (ref 70–99)
Potassium: 3.4 mmol/L — ABNORMAL LOW (ref 3.5–5.1)
Sodium: 136 mmol/L (ref 135–145)
Total Bilirubin: 0.8 mg/dL (ref 0.0–1.2)
Total Protein: 6.7 g/dL (ref 6.5–8.1)

## 2024-12-11 LAB — CBC
HCT: 36 % — ABNORMAL LOW (ref 39.0–52.0)
Hemoglobin: 12.6 g/dL — ABNORMAL LOW (ref 13.0–17.0)
MCH: 32.1 pg (ref 26.0–34.0)
MCHC: 35 g/dL (ref 30.0–36.0)
MCV: 91.6 fL (ref 80.0–100.0)
Platelets: 188 K/uL (ref 150–400)
RBC: 3.93 MIL/uL — ABNORMAL LOW (ref 4.22–5.81)
RDW: 11.6 % (ref 11.5–15.5)
WBC: 6.1 K/uL (ref 4.0–10.5)
nRBC: 0 % (ref 0.0–0.2)

## 2024-12-11 LAB — HEPARIN LEVEL (UNFRACTIONATED): Heparin Unfractionated: 0.4 [IU]/mL (ref 0.30–0.70)

## 2024-12-11 MED ORDER — POTASSIUM CHLORIDE CRYS ER 20 MEQ PO TBCR
40.0000 meq | EXTENDED_RELEASE_TABLET | Freq: Once | ORAL | Status: AC
  Administered 2024-12-11: 10:00:00 40 meq via ORAL
  Filled 2024-12-11: qty 2

## 2024-12-11 MED ORDER — PANTOPRAZOLE SODIUM 40 MG PO TBEC
40.0000 mg | DELAYED_RELEASE_TABLET | Freq: Every day | ORAL | 0 refills | Status: AC
  Filled 2024-12-11: qty 30, 30d supply, fill #0

## 2024-12-11 MED ORDER — APIXABAN (ELIQUIS) VTE STARTER PACK (10MG AND 5MG)
ORAL_TABLET | ORAL | 0 refills | Status: AC
  Filled 2024-12-11: qty 74, 30d supply, fill #0

## 2024-12-11 NOTE — Discharge Summary (Signed)
 DISCHARGE SUMMARY    Daryl Jones FMW:[id number] DOB: 1966-10-15 DOA: 12/10/2024  PCP: [facility]  Admit date: 12/10/2024 Discharge date: 12/11/2024   Recommendations for Outpatient Follow-up:  Follow up with vascular surgery within 1 month Follow-up with PCP to review chronic condition management  Hospital Course: Daryl Jones is a 58 year old male with prior cholecystectomy, daily alcohol use, remote history of tobacco abuse, hypertension, dyslipidemia, gout who presented to the ED with abrupt onset epigastric pain.  Patient reports he has recently been eating increased hot sauce and cajun seasoning on his food including just prior to arrival.  Labs and vitals in the ED were unremarkable.  EDP obtain CT abdomen pelvis which revealed severe stenosis of the right hepatic artery with appearance of partially thrombosed area that reconstitutes with good distal flow there is also some concern of liver edema in the porta hepatis.  GI and vascular surgery were consulted.  Started on heparin  drip. Dedicated CTA abdomen pelvis was performed which revealed that hepatic arteries were patent.  Area of previously described stenosis or thrombosis was not well-delineated on repeat exam.  CT does reveal fat stranding at the porta hepatis as well as hepatic steatosis.  Vascular surgery reviewed new scans and recommended Eliquis  initiation and follow-up in vascular surgery clinic within 1 month. CT does reveal changes consistent with duodenitis.  Patient was started on a PPI and reported his pain resolved entirely. By reevaluation on 12/15 patient remained pain-free.  Labs remained stable.  I did have extensive discussion with the patient regarding his alcohol use which appears to be at least daily and advised on cutting back and discontinuing entirely.  Patient seems hesitant to agree to this but his family, who is at bedside, reports that they will assist. Vascular surgery  referral was placed at DC.  Medications delivered via meds to beds.  Acute abdominal pain Duodenitis Right hepatic artery stenosis, questionable on repeat exam - Eliquis  - Outpatient follow-up with vascular surgery within 1 month - PPI at DC - LFTs and labs remained stable.  No pain after PPI administration  Hypertension - Resume home meds  Dyslipidemia - Resume home meds  Daily alcohol use - Was initiated on CIWA protocol.  Advised to significantly cut back or discontinue alcohol.  ER return precautions  History of gout - Resume home meds  Prediabetes - Outpatient follow-up  Discharge Instructions  Discharge Instructions     Ambulatory referral to Vascular Surgery   Complete by: As directed    Hospital follow up, needs to be seen in <1 month per Dr. Tisa   Call MD for:  difficulty breathing, headache or visual disturbances   Complete by: As directed    Call MD for:  persistant dizziness or light-headedness   Complete by: As directed    Call MD for:  persistant nausea and vomiting   Complete by: As directed    Call MD for:  severe uncontrolled pain   Complete by: As directed    Call MD for:  temperature >100.4   Complete by: As directed    Diet general   Complete by: As directed    Discharge instructions   Complete by: As directed    As we discussed, you have been started on a blood thinner called Eliquis . This medication will increase your risk of bleeding. If you fall and hit your head, please present to the ED immediately for evaluation. Please stop drinking alcohol. Follow up with vascular surgery within  one month. ]Follow up with your primary care physician to discuss the medication changes during this admission   Increase activity slowly   Complete by: As directed       Allergies as of 12/11/2024       Reactions   Morphine Hives, Nausea And Vomiting   Morphine And Codeine Nausea And Vomiting        Medication List     STOP taking these  medications    ibuprofen  800 MG tablet Commonly known as: ADVIL        TAKE these medications    allopurinol 300 MG tablet Commonly known as: ZYLOPRIM Take 300 mg by mouth daily.   amLODipine  5 MG tablet Commonly known as: NORVASC  Take 5 mg by mouth daily.   atorvastatin  40 MG tablet Commonly known as: LIPITOR Take 40 mg by mouth daily.   Eliquis  DVT/PE Starter Pack Generic drug: Apixaban  Starter Pack (10mg  and 5mg ) Take as directed on package: start with two-5mg  tablets twice daily for 7 days. On day 8, switch to one-5mg  tablet twice daily.   lisinopril -hydrochlorothiazide  20-25 MG tablet Commonly known as: ZESTORETIC  Take 1 tablet by mouth daily.   pantoprazole  40 MG tablet Commonly known as: Protonix  Take 1 tablet (40 mg total) by mouth at bedtime.        Allergies[1]  Consultations: Treatment Team:  Jinny Carmine, MD   Procedures/Studies: CT ANGIO ABDOMEN W &/OR WO CONTRAST Result Date: 12/10/2024 CLINICAL DATA:  Hepatic artery stenosis.  Abdominal pain. EXAM: CT ANGIOGRAPHY ABDOMEN TECHNIQUE: Multidetector CT imaging of the abdomen was performed using the standard protocol during bolus administration of intravenous contrast. Multiplanar reconstructed images and MIPs were obtained and reviewed to evaluate the vascular anatomy. RADIATION DOSE REDUCTION: This exam was performed according to the departmental dose-optimization program which includes automated exposure control, adjustment of the mA and/or kV according to patient size and/or use of iterative reconstruction technique. CONTRAST:  OMNIPAQUE  IOHEXOL  350 MG/ML SOLN COMPARISON:  12/10/2024. FINDINGS: VASCULAR Aorta: Normal caliber aorta without aneurysm, dissection, vasculitis or significant stenosis. Aortic atherosclerosis. Celiac: Patent without evidence of aneurysm, dissection, vasculitis or significant stenosis. The splenic and hepatic arteries are patent. The right and left hepatic arteries and  gastroduodenal arteries are patent. What may have represented a stenotic or thrombosed distal right hepatic artery is not definitely visualized on this exam. SMA: Patent without evidence of aneurysm, dissection, vasculitis or significant stenosis. Renals: Both renal arteries are patent without evidence of aneurysm, dissection, vasculitis, fibromuscular dysplasia or significant stenosis. IMA: Patent without evidence of aneurysm, dissection, vasculitis or significant stenosis. Inflow: Patent without evidence of aneurysm, dissection, vasculitis or significant stenosis. Veins: The portal vein, splenic vein, and superior mesenteric veins are patent. The IVC and hepatic veins are within normal limits. Review of the MIP images confirms the above findings. NON-VASCULAR Lower chest: Heart is borderline enlarged and there is no pericardial effusion. A few coronary artery calcifications are noted. There are trace bilateral pleural effusions with atelectasis. Hepatobiliary: No focal abnormality in the liver. Fatty infiltration of the liver is noted. No biliary ductal dilatation. The gallbladder is not seen and may be surgically absent. There is redemonstration of fat stranding and edema at the porta hepatis, unchanged from the previous exam. Pancreas: Unremarkable. No pancreatic ductal dilatation or surrounding inflammatory changes. Spleen: Normal in size without focal abnormality. Adrenals/Urinary Tract: The adrenal glands are within normal limits. The kidneys enhance symmetrically. A stone is present in the mid left kidney. No hydronephrosis bilaterally. Stomach/Bowel:  There is a small hiatal hernia. The stomach is otherwise within normal limits. There is thickening of the walls of the proximal duodenum in the region of inflammatory changes. No bowel obstruction, free air, or pneumatosis is seen. Colonic diverticula are present without evidence of diverticulitis. Lymphatic: No abdominal or pelvic lymphadenopathy. Other: No  free fluid. Musculoskeletal: Degenerative changes are present in the thoracolumbar spine. No acute osseous abnormality. IMPRESSION: VASCULAR 1. Celiac, hepatic, gastroduodenal, and major branches of the left and right hepatic arteries appear patent. The previously described area of possible stenosis or thrombosis of the right hepatic artery is not well delineated on this exam. 2. Aortic atherosclerosis. NON-VASCULAR 1. Persistent fat stranding at the porta hepatis. 2. Hepatic steatosis.  No focal abnormality is seen in the liver. 3. Mild thickening of the first and second portion of the duodenum in the region of inflammatory changes, possible duodenitis. 4. Small hiatal hernia. 5. Left renal calculus. Electronically Signed   By: Leita Birmingham M.D.   On: 12/10/2024 16:32   CT ABDOMEN PELVIS W CONTRAST Result Date: 12/10/2024 EXAM: CT ABDOMEN AND PELVIS WITH CONTRAST 12/10/2024 02:48:14 AM TECHNIQUE: CT of the abdomen and pelvis was performed with the administration of 100 mL of iohexol  (OMNIPAQUE ) 300 MG/ML solution. Multiplanar reformatted images are provided for review. Automated exposure control, iterative reconstruction, and/or weight-based adjustment of the mA/kV was utilized to reduce the radiation dose to as low as reasonably achievable. COMPARISON: CT with iv contrast 03/17/21. CLINICAL HISTORY: Upper abdominal pain, onset after his last meal. Acetaminophen  and ibuprofen  did not help. He had a cholecystectomy on 03/17/2021. No more recent surgeries are referenced in the medical record. FINDINGS: LOWER CHEST: No acute abnormality. LIVER: The liver is mildly steatotic. No mass is seen. There is edema in the porta hepatis. The hepatic portal vein and intrahepatic branches opacify well. The left hepatic artery and branches opacify well. There is severe stenosis in the right hepatic artery on series 2, axial image 21 and series 5 coronal reconstruction image 47. . The vessel appears to be at least partially  thrombosed but it does reconstitute at the bifurcation with good flow distal to this. It is unclear what may be causing the edema/inflammation in this area. This could be related to the proximal descending segment of the duodenum such as duodenitis or could be related to extrahepatic cholangitis. This is above the level of the pancreas and is considered less likely to be a reflection of pancreatitis. GALLBLADDER AND BILE DUCTS: Surgical absence of the gallbladder. No biliary ductal dilatation. SPLEEN: No acute abnormality. PANCREAS: There is no pancreatic mass or ductal dilatation. ADRENAL GLANDS: There is no adrenal mass. KIDNEYS, URETERS AND BLADDER: There is no renal mass. There is chronic perinephric stranding which is unchanged. There is a 1.5 x 0.7 x 0.8 cm stone again in the inferior pole collecting system of the left kidney. No other stones are seen. No hydronephrosis or ureteral stones. The urinary bladder is unremarkable. GI AND BOWEL: The gastric wall is unremarkable. There are mildly thickened folds in the descending duodenum with the remainder of the small bowel unremarkable. The appendix is normal. There are descending and sigmoid diverticula without evidence of diverticulitis. PERITONEUM AND RETROPERITONEUM: No ascites. No free air. No free hemorrhage or localizing collections. VASCULATURE: Aorta is normal in caliber. Minimal aortic atherosclerosis is seen without aneurysm. Apart from the right hepatic main artery, The other celiac branches opacify well with no branch occlusions including the right hepatic artery branches. No  other significant vascular findings. LYMPH NODES: No lymphadenopathy. There is a calcified lymph node within the right inguinal canal. REPRODUCTIVE ORGANS: No acute abnormality. BONES AND SOFT TISSUES: There are small inguinal fat hernias. There are mild degenerative changes of the spine and hip joints, asymmetric sclerosis and spurring of the left pubic bone. IMPRESSION: 1.  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 2. Porta hepatis edema, favored inflammatory, with differential including duodenitis versus extrahepatic cholangitis; no pancreatic mass or ductal dilatation. These inflammatory changes are above the pancreatic level, considered less likely to be due to pancreatitis. 3. Mildly thickened folds in the descending duodenum, which may reflect duodenitis. 4. Nonobstructing 1.5 x 0.7 x 0.8 cm left inferior pole renal calyceal stone without hydronephrosis or ureteral calculi. Electronically signed by: Francis Quam MD 12/10/2024 03:26 AM EST RP Workstation: [id number]      Discharge Exam: Vitals:   12/11/24 0735 12/11/24 0752  BP: (!) 144/95   Pulse: 76   Resp: 18   Temp:  99 F (37.2 C)  SpO2: 94%    Vitals:   12/11/24 0334 12/11/24 0355 12/11/24 0735 12/11/24 0752  BP:  127/87 (!) 144/95   Pulse:  81 76   Resp:  19 18   Temp: 99 F (37.2 C) 98.4 F (36.9 C)  99 F (37.2 C)  TempSrc:  Oral  Oral  SpO2:  97% 94%   Weight:      Height:        Constitutional:  Normal appearance. Non toxic-appearing.  HENT: Head Normocephalic and atraumatic.  Mucous membranes are moist.  Eyes:  Extraocular intact. Conjunctivae normal.  Cardiovascular: Rate and Rhythm: Normal rate and regular rhythm.  Pulmonary: Non labored, symmetric rise of chest wall.  Skin: warm and dry. not jaundiced.  Neurological: No focal deficit present. alert. Oriented.  Psychiatric: Mood and Affect congruent.    The results of significant diagnostics from this hospitalization (including imaging, microbiology, ancillary and laboratory) are listed below for reference.     Microbiology: No results found for this or any previous visit (from the past 240 hours).   Labs: BNP (last 3 results) No results for input(s): BNP in the last 8760 hours. Basic Metabolic Panel: Recent  Labs  Lab 12/09/24 2242 12/11/24 0402  NA 138 136  K 3.7 3.4*  CL 98 99  CO2 24 25  GLUCOSE 96 97  BUN 16 13  CREATININE 0.83 0.85  CALCIUM  9.3 8.8*   Liver Function Tests: Recent Labs  Lab 12/09/24 2242 12/11/24 0402  AST 29 20  ALT 21 20  ALKPHOS 61 53  BILITOT 0.9 0.8  PROT 7.8 6.7  ALBUMIN 4.4 3.7   Recent Labs  Lab 12/09/24 2242  LIPASE 21   No results for input(s): AMMONIA in the last 168 hours. CBC: Recent Labs  Lab 12/09/24 2242 12/11/24 0402  WBC 9.1 6.1  HGB 13.9 12.6*  HCT 40.8 36.0*  MCV 94.0 91.6  PLT 227 188   Cardiac Enzymes: No results for input(s): CKTOTAL, CKMB, CKMBINDEX, TROPONINI in the last 168 hours. BNP: Invalid input(s): POCBNP CBG: No results for input(s): GLUCAP in the last 168 hours. D-Dimer No results for input(s): DDIMER in the last 72 hours. Hgb A1c No results for input(s): HGBA1C in the last 72 hours. Lipid Profile Recent Labs    12/10/24 0131  CHOL 126  HDL 47  LDLCALC 65  TRIG 68  CHOLHDL 2.7   Thyroid function studies No results for input(s): TSH, T4TOTAL, T3FREE, THYROIDAB in the last 72 hours.  Invalid input(s): FREET3 Anemia work up No results for input(s): VITAMINB12, FOLATE, FERRITIN, TIBC, IRON, RETICCTPCT in the last 72 hours. Urinalysis    Component Value Date/Time   COLORURINE YELLOW (A) 12/09/2024 2242   APPEARANCEUR CLEAR (A) 12/09/2024 2242   LABSPEC 1.036 (H) 12/09/2024 2242   PHURINE 5.0 12/09/2024 2242   GLUCOSEU NEGATIVE 12/09/2024 2242   HGBUR NEGATIVE 12/09/2024 2242   BILIRUBINUR NEGATIVE 12/09/2024 2242   KETONESUR NEGATIVE 12/09/2024 2242   PROTEINUR 30 (A) 12/09/2024 2242   NITRITE NEGATIVE 12/09/2024 2242   LEUKOCYTESUR NEGATIVE 12/09/2024 2242   Sepsis Labs Recent Labs  Lab 12/09/24 2242 12/11/24 0402  WBC 9.1 6.1   Microbiology No results found for this or any previous visit (from the past 240 hours).   Time coordinating  discharge: 32 min   SIGNED: Eddith Mentor, DO [facility] 12/11/2024, 1:41 PM Pager   If 7PM-7AM, please contact night-coverage     [1]  Allergies Allergen Reactions   Morphine Hives and Nausea And Vomiting   Morphine And Codeine Nausea And Vomiting

## 2024-12-11 NOTE — Progress Notes (Signed)
 ANTICOAGULATION CONSULT NOTE  Pharmacy Consult for heparin  infusion Indication: hepatic artery clot  Allergies[1]  Patient Measurements: Height: 5' 11 (180.3 cm) Weight: 105.7 kg (233 lb) IBW/kg (Calculated) : 75.3 HEPARIN  DW (KG): 97.6  Labs: Recent Labs    12/09/24 2242 12/10/24 0428 12/10/24 1134 12/10/24 1824 12/11/24 0402  HGB 13.9  --   --   --  12.6*  HCT 40.8  --   --   --  36.0*  PLT 227  --   --   --  188  APTT  --  27  --   --   --   LABPROT  --  13.7  --   --   --   INR  --  1.0  --   --   --   HEPARINUNFRC  --   --  0.49 0.31 0.40  CREATININE 0.83  --   --   --   --     Estimated Creatinine Clearance: 117.1 mL/min (by C-G formula based on SCr of 0.83 mg/dL).   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  1214 1134 HL 0.49, thera x 1; 1650 un/hr 1214 1824 HL 0.31, therapeutic x 2 1215 0402 HL 0.40, therapeutic x 3  Goal of Therapy:  Heparin  level 0.3-0.7 units/ml Monitor platelets by anticoagulation protocol: Yes   Plan:  --Heparin  level is therapeutic x 3 --Continue heparin  infusion at 1650 units/hr --Re-check HL daily with AM labs --Daily CBC per protocol while on IV heparin   Rankin CANDIE Dills, PharmD, [facility] 12/11/2024 4:45 AM        [1]  Allergies Allergen Reactions   Morphine Hives and Nausea And Vomiting   Morphine And Codeine Nausea And Vomiting

## 2024-12-11 NOTE — TOC CM/SW Note (Signed)
 Transition of Care [facility]) - Inpatient Brief Assessment   Patient Details  Name: Daryl Jones MRN: [id number] Date of Birth: 22-Dec-1966  Transition of [facility]) CM/SW Contact:    Nathanael CHRISTELLA Ring, RN Phone Number: 12/11/2024, 10:11 AM   Clinical Narrative:  Transition of Care [facility]) Screening Note   Patient Details  Name: Daryl Jones Date of Birth: March 25, 1966   Transition of Care [facility]) CM/SW Contact:    Nathanael CHRISTELLA Ring, RN Phone Number: 12/11/2024, 10:11 AM    Transition of Care Department [facility]) has reviewed patient and no TOC needs have been identified at this time. If new patient transition needs arise, please place a TOC consult.     Transition of Care Asessment: Insurance and Status: Insurance coverage has been reviewed Patient has primary care physician: Yes Home environment has been reviewed: From home with wife Prior level of function:: independent Prior/Current Home Services: No current home services Social Drivers of Health Review: SDOH reviewed no interventions necessary Readmission risk has been reviewed: Yes Transition of care needs: no transition of care needs at this time

## 2025-01-09 NOTE — Progress Notes (Signed)
 "                                                                      MRN : [id number]  Daryl Jones is a 59 y.o. (Aug 02, 1966) male who presents with chief complaint of check circulation.  History of Present Illness:  The patient presents to the office for follow-up regarding hepatic artery stenosis versus occlusion.  The patient was seen in the hospital on December 10, 2024.  Initial presenting symptoms were right upper quadrant abdominal pain.  This resolved over a 24-hour period.  CT angiogram dated 12/10/2024 is reviewed by me and I concur with the radiology interpretation the hepatic artery particularly the right hepatic artery does appear to be patent there is not a clear stenotic lesion or occlusion noted.  Associated findings of inflammation of the duodenum is present.  Per vascular recommendation he was started on Eliquis .  The patient denies abdominal pain or postprandial symptoms.  The patient denies weight loss as well as nausea.  The patient does not substantiate food fear, particular foods do not seem to aggravate or alleviate the symptoms.  The patient denies bloody bowel movements or diarrhea.    No history of peptic ulcer disease.   No prior peripheral angiograms or vascular interventions.  The patient denies amaurosis fugax or recent TIA symptoms. There are no recent neurological changes noted. The patient denies claudication symptoms or rest pain symptoms. The patient denies history of DVT, PE or superficial thrombophlebitis. The patient denies recent episodes of angina    Active Medications[1]  Past Medical History:  Diagnosis Date   Gout    HLD (hyperlipidemia)    Hypertension    Pre-diabetes     Past Surgical History:  Procedure Laterality Date   CHOLECYSTECTOMY  03/17/2021    Social History Social History[2]  Family History Family History  Problem Relation Age of Onset   Diabetes Mother     Allergies[3]   REVIEW OF SYSTEMS (Negative unless  checked)  Constitutional: [] Weight loss  [] Fever  [] Chills Cardiac: [] Chest pain   [] Chest pressure   [] Palpitations   [] Shortness of breath when laying flat   [] Shortness of breath with exertion. Vascular:  [x] Pain in legs with walking   [] Pain in legs at rest  [] History of DVT   [] Phlebitis   [] Swelling in legs   [] Varicose veins   [] Non-healing ulcers Pulmonary:   [] Uses home oxygen   [] Productive cough   [] Hemoptysis   [] Wheeze  [] COPD   [] Asthma Neurologic:  [] Dizziness   [] Seizures   [] History of stroke   [] History of TIA  [] Aphasia   [] Vissual changes   [] Weakness or numbness in arm   [] Weakness or numbness in leg Musculoskeletal:   [] Joint swelling   [] Joint pain   [] Low back pain Hematologic:  [] Easy bruising  [] Easy bleeding   [] Hypercoagulable state   [] Anemic Gastrointestinal:  [] Diarrhea   [] Vomiting  [] Gastroesophageal reflux/heartburn   [] Difficulty swallowing. Genitourinary:  [] Chronic kidney disease   [] Difficult urination  [] Frequent urination   [] Blood in urine Skin:  [] Rashes   [] Ulcers  Psychological:  [] History of anxiety   []  History of major depression.  Physical Examination  There were no vitals filed for this visit. There  is no height or weight on file to calculate BMI. Gen: WD/WN, NAD Head: [place]/AT, No temporalis wasting.  Ear/Nose/Throat: Hearing grossly intact, nares w/o erythema or drainage Eyes: PER, EOMI, sclera nonicteric.  Neck: Supple, no masses.  No bruit or JVD.  Pulmonary:  Good air movement, no audible wheezing, no use of accessory muscles.  Cardiac: RRR, normal S1, S2, no Murmurs. Vascular:  mild trophic changes, no open wounds Vessel Right Left  Radial Palpable Palpable  Gastrointestinal: soft, non-distended. No guarding/no peritoneal signs.  Musculoskeletal: M/S 5/5 throughout.  No visible deformity.  Neurologic: CN 2-12 intact. Pain and light touch intact in extremities.  Symmetrical.  Speech is fluent. Motor exam as listed above. Psychiatric:  Judgment intact, Mood & affect appropriate for pt's clinical situation. Dermatologic: No rashes or ulcers noted.  No changes consistent with cellulitis.   CBC Lab Results  Component Value Date   WBC 6.1 12/11/2024   HGB 12.6 (L) 12/11/2024   HCT 36.0 (L) 12/11/2024   MCV 91.6 12/11/2024   PLT 188 12/11/2024    BMET    Component Value Date/Time   NA 136 12/11/2024 0402   K 3.4 (L) 12/11/2024 0402   CL 99 12/11/2024 0402   CO2 25 12/11/2024 0402   GLUCOSE 97 12/11/2024 0402   BUN 13 12/11/2024 0402   CREATININE 0.85 12/11/2024 0402   CALCIUM  8.8 (L) 12/11/2024 0402   GFRNONAA >60 12/11/2024 0402   CrCl cannot be calculated (Patient's most recent lab result is older than the maximum 21 days allowed.).  COAG Lab Results  Component Value Date   INR 1.0 12/10/2024    Radiology No results found.   Assessment/Plan 1. Hepatic artery stenosis, right (Primary) Recommend:  I do not find evidence of life style limiting vascular disease. The patient specifically denies any abdominal complaints or life style limitation.  Previous noninvasive studies including CT angiogram of the abdomen and pelvis do not identify critical vascular problems.  In fact, there is question as to whether there is any hepatic artery abnormality at all.  Given his completely asymptomatic situation I do not recommend any invasive testing to better define what is likely not an issue.  The patient should continue walking and begin a more formal exercise program. The patient should continue his antiplatelet therapy and aggressive treatment of the lipid abnormalities.  The patient is instructed to call the office if there is a significant change in the lower extremity symptoms, particularly if a wound develops or there is an abrupt increase in abdominal pain.  Patient will follow-up with me on a PRN basis  2. Essential hypertension Continue antihypertensive medications as already ordered, these medications  have been reviewed and there are no changes at this time.  3. Mixed hyperlipidemia Continue statin as ordered and reviewed, no changes at this time  4. Duodenitis This was the likely cause of his abdominal pain and appears to be resolved    Cordella Shawl, MD  01/09/2025 4:00 PM      [1]  No outpatient medications have been marked as taking for the 01/11/25 encounter (Appointment) with Shawl, Cordella MATSU, MD.  [2]  Social History Tobacco Use   Smoking status: Former    Types: Cigarettes   Smokeless tobacco: Never  Vaping Use   Vaping status: Never Used  Substance Use Topics   Alcohol use: Yes    Alcohol/week: 14.0 - 21.0 standard drinks of alcohol    Types: 14 - 21 Cans of beer per week  Comment: 2-3 beers per day  [3]  Allergies Allergen Reactions   Morphine Hives and Nausea And Vomiting   Morphine And Codeine Nausea And Vomiting   "

## 2025-01-10 DIAGNOSIS — I771 Stricture of artery: Secondary | ICD-10-CM | POA: Insufficient documentation

## 2025-01-10 DIAGNOSIS — I10 Essential (primary) hypertension: Secondary | ICD-10-CM | POA: Insufficient documentation

## 2025-01-10 DIAGNOSIS — K2980 Duodenitis without bleeding: Secondary | ICD-10-CM | POA: Insufficient documentation

## 2025-01-10 DIAGNOSIS — E785 Hyperlipidemia, unspecified: Secondary | ICD-10-CM | POA: Insufficient documentation

## 2025-01-11 ENCOUNTER — Ambulatory Visit (INDEPENDENT_AMBULATORY_CARE_PROVIDER_SITE_OTHER): Admitting: Vascular Surgery

## 2025-01-11 ENCOUNTER — Encounter (INDEPENDENT_AMBULATORY_CARE_PROVIDER_SITE_OTHER): Payer: Self-pay | Admitting: Vascular Surgery

## 2025-01-11 VITALS — BP 115/79 | HR 88 | Resp 17 | Ht 71.0 in | Wt 240.0 lb

## 2025-01-11 DIAGNOSIS — I771 Stricture of artery: Secondary | ICD-10-CM

## 2025-01-11 DIAGNOSIS — I10 Essential (primary) hypertension: Secondary | ICD-10-CM

## 2025-01-11 DIAGNOSIS — K2980 Duodenitis without bleeding: Secondary | ICD-10-CM | POA: Diagnosis not present

## 2025-01-11 DIAGNOSIS — E782 Mixed hyperlipidemia: Secondary | ICD-10-CM

## 2025-01-14 ENCOUNTER — Encounter (INDEPENDENT_AMBULATORY_CARE_PROVIDER_SITE_OTHER): Payer: Self-pay | Admitting: Vascular Surgery
